# Patient Record
Sex: Female | Born: 1935
Health system: Southern US, Community
[De-identification: ages and names within clinical notes are randomized; demographics above are authoritative.]

## PROBLEM LIST (undated history)

## (undated) DIAGNOSIS — I10 Essential (primary) hypertension: Secondary | ICD-10-CM

## (undated) DIAGNOSIS — E079 Disorder of thyroid, unspecified: Secondary | ICD-10-CM

## (undated) DIAGNOSIS — E119 Type 2 diabetes mellitus without complications: Secondary | ICD-10-CM

## (undated) HISTORY — DX: Disorder of thyroid, unspecified: E07.9

## (undated) HISTORY — DX: Type 2 diabetes mellitus without complications: E11.9

## (undated) HISTORY — PX: HERNIA REPAIR: SHX51

## (undated) HISTORY — PX: ABDOMINAL HYSTERECTOMY: SHX81

## (undated) HISTORY — PX: APPENDECTOMY: SHX54

## (undated) HISTORY — DX: Essential (primary) hypertension: I10

---

## 2000-03-19 HISTORY — PX: BREAST EXCISIONAL BIOPSY: SUR124

## 2000-04-08 ENCOUNTER — Ambulatory Visit (HOSPITAL_COMMUNITY): Admission: RE | Admit: 2000-04-08 | Discharge: 2000-04-08 | Payer: Self-pay | Admitting: Cardiology

## 2000-05-03 ENCOUNTER — Encounter: Admission: RE | Admit: 2000-05-03 | Discharge: 2000-05-03 | Payer: Self-pay | Admitting: General Surgery

## 2000-05-03 ENCOUNTER — Other Ambulatory Visit: Admission: RE | Admit: 2000-05-03 | Discharge: 2000-05-03 | Payer: Self-pay | Admitting: General Surgery

## 2000-05-03 ENCOUNTER — Encounter (HOSPITAL_BASED_OUTPATIENT_CLINIC_OR_DEPARTMENT_OTHER): Payer: Self-pay | Admitting: General Surgery

## 2001-05-06 ENCOUNTER — Encounter: Admission: RE | Admit: 2001-05-06 | Discharge: 2001-05-06 | Payer: Self-pay | Admitting: General Surgery

## 2001-05-06 ENCOUNTER — Encounter (HOSPITAL_BASED_OUTPATIENT_CLINIC_OR_DEPARTMENT_OTHER): Payer: Self-pay | Admitting: General Surgery

## 2001-10-17 ENCOUNTER — Encounter: Payer: Self-pay | Admitting: Cardiology

## 2001-10-17 ENCOUNTER — Encounter: Admission: RE | Admit: 2001-10-17 | Discharge: 2001-10-17 | Payer: Self-pay | Admitting: Cardiology

## 2001-11-03 ENCOUNTER — Encounter: Admission: RE | Admit: 2001-11-03 | Discharge: 2002-02-01 | Payer: Self-pay | Admitting: Cardiology

## 2002-12-01 ENCOUNTER — Encounter: Admission: RE | Admit: 2002-12-01 | Discharge: 2003-03-01 | Payer: Self-pay | Admitting: Cardiology

## 2003-02-10 ENCOUNTER — Encounter: Admission: RE | Admit: 2003-02-10 | Discharge: 2003-02-10 | Payer: Self-pay | Admitting: Cardiology

## 2004-06-27 ENCOUNTER — Encounter: Admission: RE | Admit: 2004-06-27 | Discharge: 2004-06-27 | Payer: Self-pay | Admitting: Cardiology

## 2004-09-04 ENCOUNTER — Ambulatory Visit (HOSPITAL_COMMUNITY): Admission: RE | Admit: 2004-09-04 | Discharge: 2004-09-04 | Payer: Self-pay | Admitting: Neurology

## 2004-09-09 ENCOUNTER — Ambulatory Visit (HOSPITAL_COMMUNITY): Admission: RE | Admit: 2004-09-09 | Discharge: 2004-09-09 | Payer: Self-pay | Admitting: Cardiology

## 2004-10-26 ENCOUNTER — Encounter: Admission: RE | Admit: 2004-10-26 | Discharge: 2004-10-26 | Payer: Self-pay | Admitting: Gastroenterology

## 2006-06-18 ENCOUNTER — Encounter (HOSPITAL_COMMUNITY): Admission: RE | Admit: 2006-06-18 | Discharge: 2006-06-18 | Payer: Self-pay | Admitting: Cardiology

## 2007-03-20 HISTORY — PX: BREAST BIOPSY: SHX20

## 2007-04-07 ENCOUNTER — Encounter: Admission: RE | Admit: 2007-04-07 | Discharge: 2007-04-07 | Payer: Self-pay | Admitting: Cardiology

## 2007-04-07 ENCOUNTER — Encounter (INDEPENDENT_AMBULATORY_CARE_PROVIDER_SITE_OTHER): Payer: Self-pay | Admitting: Diagnostic Radiology

## 2007-05-21 ENCOUNTER — Encounter: Admission: RE | Admit: 2007-05-21 | Discharge: 2007-05-21 | Payer: Self-pay | Admitting: General Surgery

## 2007-09-02 ENCOUNTER — Encounter: Admission: RE | Admit: 2007-09-02 | Discharge: 2007-09-02 | Payer: Self-pay | Admitting: Cardiology

## 2007-09-05 ENCOUNTER — Encounter: Admission: RE | Admit: 2007-09-05 | Discharge: 2007-09-05 | Payer: Self-pay | Admitting: Gastroenterology

## 2008-03-10 ENCOUNTER — Encounter: Admission: RE | Admit: 2008-03-10 | Discharge: 2008-03-10 | Payer: Self-pay | Admitting: Cardiology

## 2008-03-15 ENCOUNTER — Encounter: Admission: RE | Admit: 2008-03-15 | Discharge: 2008-03-15 | Payer: Self-pay | Admitting: Cardiology

## 2009-03-16 ENCOUNTER — Encounter: Admission: RE | Admit: 2009-03-16 | Discharge: 2009-03-16 | Payer: Self-pay | Admitting: Cardiology

## 2009-05-17 ENCOUNTER — Encounter (HOSPITAL_COMMUNITY): Admission: RE | Admit: 2009-05-17 | Discharge: 2009-07-19 | Payer: Self-pay | Admitting: Endocrinology

## 2010-04-04 ENCOUNTER — Encounter
Admission: RE | Admit: 2010-04-04 | Discharge: 2010-04-04 | Payer: Self-pay | Source: Home / Self Care | Attending: Cardiology | Admitting: Cardiology

## 2010-04-09 ENCOUNTER — Encounter: Payer: Self-pay | Admitting: Cardiology

## 2010-05-18 ENCOUNTER — Other Ambulatory Visit: Payer: Self-pay | Admitting: Cardiology

## 2010-05-19 ENCOUNTER — Ambulatory Visit
Admission: RE | Admit: 2010-05-19 | Discharge: 2010-05-19 | Disposition: A | Discharge status: Home / Self Care | Payer: Medicare Other | Source: Ambulatory Visit | Attending: Cardiology | Admitting: Cardiology

## 2010-06-02 ENCOUNTER — Other Ambulatory Visit: Payer: Self-pay | Admitting: Gastroenterology

## 2010-06-07 ENCOUNTER — Ambulatory Visit
Admission: RE | Admit: 2010-06-07 | Discharge: 2010-06-07 | Disposition: A | Discharge status: Home / Self Care | Payer: Medicare Other | Source: Ambulatory Visit | Attending: Gastroenterology | Admitting: Gastroenterology

## 2010-06-07 MED ORDER — IOHEXOL 300 MG/ML  SOLN
80.0000 mL | Freq: Once | INTRAMUSCULAR | Status: AC | PRN
  Administered 2010-06-07: 80 mL via INTRAVENOUS

## 2010-06-12 ENCOUNTER — Other Ambulatory Visit (HOSPITAL_COMMUNITY): Payer: Self-pay | Admitting: Gastroenterology

## 2010-06-27 ENCOUNTER — Encounter (HOSPITAL_COMMUNITY): Payer: Self-pay

## 2010-06-27 ENCOUNTER — Encounter (HOSPITAL_COMMUNITY)
Admission: RE | Admit: 2010-06-27 | Discharge: 2010-06-27 | Disposition: A | Discharge status: Home / Self Care | Payer: Medicare Other | Source: Ambulatory Visit | Attending: Gastroenterology | Admitting: Gastroenterology

## 2010-06-27 DIAGNOSIS — R110 Nausea: Secondary | ICD-10-CM | POA: Insufficient documentation

## 2010-06-27 DIAGNOSIS — R109 Unspecified abdominal pain: Secondary | ICD-10-CM | POA: Insufficient documentation

## 2010-06-27 MED ORDER — TECHNETIUM TC 99M MEBROFENIN IV KIT
5.3000 | PACK | Freq: Once | INTRAVENOUS | Status: AC | PRN
  Administered 2010-06-27: 5.3 via INTRAVENOUS

## 2010-06-27 MED ORDER — SINCALIDE 5 MCG IJ SOLR: 0.0200 ug/kg | Freq: Once | INTRAMUSCULAR | Status: DC

## 2010-12-28 ENCOUNTER — Inpatient Hospital Stay (HOSPITAL_BASED_OUTPATIENT_CLINIC_OR_DEPARTMENT_OTHER)
Admission: RE | Admit: 2010-12-28 | Discharge: 2010-12-28 | Disposition: A | Discharge status: Home / Self Care | Payer: Medicare Other | Source: Ambulatory Visit | Attending: Cardiology | Admitting: Cardiology

## 2010-12-28 DIAGNOSIS — E119 Type 2 diabetes mellitus without complications: Secondary | ICD-10-CM | POA: Insufficient documentation

## 2010-12-28 DIAGNOSIS — I2510 Atherosclerotic heart disease of native coronary artery without angina pectoris: Secondary | ICD-10-CM | POA: Insufficient documentation

## 2010-12-28 DIAGNOSIS — I2582 Chronic total occlusion of coronary artery: Secondary | ICD-10-CM | POA: Insufficient documentation

## 2010-12-28 DIAGNOSIS — E039 Hypothyroidism, unspecified: Secondary | ICD-10-CM | POA: Insufficient documentation

## 2010-12-28 DIAGNOSIS — E7800 Pure hypercholesterolemia, unspecified: Secondary | ICD-10-CM | POA: Insufficient documentation

## 2010-12-28 DIAGNOSIS — I10 Essential (primary) hypertension: Secondary | ICD-10-CM | POA: Insufficient documentation

## 2010-12-28 LAB — POCT I-STAT GLUCOSE
Glucose, Bld: 170 mg/dL — ABNORMAL HIGH (ref 70–99)
Operator id: 141321

## 2011-01-11 NOTE — Cardiovascular Report (Signed)
Carolyn Bowen, Carolyn Bowen                 ACCOUNT NO.:  [id number]  MEDICAL RECORD NO.:  [id number]  LOCATION:  XRAY                         FACILITY:  [facility]  PHYSICIAN:  Breyon Sigg N. Terrence Dupont, M.D. DATE OF BIRTH:  04/24/1927  DATE OF PROCEDURE:  12/28/2010 DATE OF DISCHARGE:  06/27/2010                           CARDIAC CATHETERIZATION   PROCEDURE:  Left cardiac cath with selective left and right coronary angiography, measurement of LVEDP via right groin using Judkins technique.  INDICATIONS FOR THE PROCEDURE:  Carolyn Bowen is an 77 year old black female with past medical history significant for hypertension, non-insulin- dependent diabetes mellitus, hypercholesteremia, hyperthyroidism complains of vague recurrent retrosternal chest pain lasting few minutes, relieves with rest.  Also complains of exertional chest pain while working in the yard, relieves with rest.  Denies any nausea, vomiting, diaphoresis.  Denies shortness of breath. Denies any palpitation, lightheadedness, or syncope.  Denies PND, orthopnea, but complains of occasional leg swelling.  Denies relation of chest pain to food, breathing, or movement.  Denies any cardiac workup in the past.  PAST MEDICAL HISTORY:  As above.  PAST SURGICAL HISTORY:  He had right inguinal hernia repair, had appendectomy in the past, hysterectomy in the past, had ganglia cyst resection in the past.  SOCIAL HISTORY:  She is widowed.  Three children.  Retired.  No history of smoking or alcohol abuse.  FAMILY HISTORY:  Mother died at the age of 32 due to complications of congestive heart failure.  Father died of CA of prostate.  One brother had MI at the age of 8, one sister in good health.  ALLERGIES:  She is allergic to PENICILLIN AND CODEINE.  MEDICATION AT HOME:  She is on aspirin, Exforge, Nexium, atorvastatin, Premarin, methimazole, meclizine, Actos was recently stopped due to leg swelling.  EXAMINATION:  GENERAL: She is alert, awake,  oriented x3, in no acute distress. VITAL SIGNS: Blood pressure was 150/70, pulse was 75. HEENT: Conjunctiva was pink. NECK: Supple.  No JVD.  No bruit. LUNGS:  Clear to auscultation without rhonchi or rales. CARDIOVASCULAR: S1, S2 was normal.  There was soft systolic murmur. ABDOMEN: Soft.  Bowel sounds were present.  Nontender. EXTREMITIES: There is no clubbing, cyanosis, or edema.  IMPRESSION:  New-onset angina, rule out coronary insufficiency. Hypertension, non-insulin-dependent diabetes mellitus, not controlled by diet; hypercholesteremia; hypothyroidism; mild renal insufficiency. Discussed with the patient various options of treatment, i.e., noninvasive stress testing versus left cath, its risks and benefits, i.e., death, MI, stroke, local vascular complications, etc and consented for the left cath.  PROCEDURE:  After obtaining the informed consent, the patient was brought to the cath lab and was placed on fluoroscopy table.  Right groin was prepped and draped in usual fashion.  Xylocaine 1% was used for local anesthesia in the right groin.  With the help of thin-wall needle, 4-French arterial sheath was placed.  The sheath was aspirated and flushed.  Next, 4-French left Judkins catheter was advanced over the wire under fluoroscopic guidance up to the ascending aorta.  Wire was pulled out.  The catheter was aspirated and connected to the manifold. Catheter was further advanced and engaged into left coronary ostium. Multiple views of  the left system were taken.  Next, catheter was disengaged and was pulled out over the wire and was replaced with 4- [place] 3D right diagnostic catheter which was advanced over the wire under fluoroscopic guidance up to the ascending aorta.  Wire was pulled out.  The catheter was aspirated and connected to the manifold. Catheter was further advanced and engaged into right coronary ostium. Multiple views of the right system were taken.  Next, catheter  was disengaged and was pulled out over the wire and was replaced with 4- [place] pigtail catheter which was advanced over the wire under fluoroscopic guidance up to the ascending aorta.  Wire was pulled out. The catheter was aspirated and connected to the manifold.  Catheter was further advanced across the aortic valve into the LV.  LV pressures were recorded.  Then, pullback pressures were recorded from the aorta.  There was no significant gradient across the aortic valve.  Next, the pigtail catheter was pulled out over the wire, sheaths were aspirated and flushed.  FINDINGS:  LVEDP was 18 mmHg.  Left main was patent.  LAD has 30%-40% mid stenosis.  Diagonal I is very very small.  Diagonal II has 30%-40% ostial stenosis and then 100% occluded beyond the proximal portion. Distally the vessel  was very very small.  Left circumflex is patent. OM1 is very very small, OM2 is moderate size which is patent.  OM3 is very small which is patent.  RCA has 30%-45% proximal stenosis.  PDA and PLV branches were small which were patent.  The patient tolerated procedure well.  There were no complications.  The patient was transferred to recovery room in stable condition.     Carolyn Lai. Terrence Dupont, M.D.     MNH/MEDQ  D:  12/28/2010  T:  12/28/2010  Job:  [id number]  Electronically Signed by Charolette Forward M.D. on 01/11/2011 10:02:45 AM

## 2011-03-16 ENCOUNTER — Other Ambulatory Visit: Payer: Self-pay | Admitting: Cardiology

## 2011-03-16 DIAGNOSIS — Z1231 Encounter for screening mammogram for malignant neoplasm of breast: Secondary | ICD-10-CM

## 2011-04-09 ENCOUNTER — Ambulatory Visit
Admission: RE | Admit: 2011-04-09 | Discharge: 2011-04-09 | Disposition: A | Discharge status: Home / Self Care | Payer: Medicare Other | Source: Ambulatory Visit | Attending: Cardiology | Admitting: Cardiology

## 2011-04-09 DIAGNOSIS — Z1231 Encounter for screening mammogram for malignant neoplasm of breast: Secondary | ICD-10-CM

## 2011-09-29 ENCOUNTER — Other Ambulatory Visit: Payer: Self-pay | Admitting: Cardiology

## 2012-02-21 ENCOUNTER — Other Ambulatory Visit (HOSPITAL_COMMUNITY): Payer: Self-pay | Admitting: Gastroenterology

## 2012-02-21 DIAGNOSIS — R110 Nausea: Secondary | ICD-10-CM

## 2012-02-21 DIAGNOSIS — R140 Abdominal distension (gaseous): Secondary | ICD-10-CM

## 2012-02-21 DIAGNOSIS — R109 Unspecified abdominal pain: Secondary | ICD-10-CM

## 2012-03-03 ENCOUNTER — Ambulatory Visit (HOSPITAL_COMMUNITY)
Admission: RE | Admit: 2012-03-03 | Discharge: 2012-03-03 | Disposition: A | Discharge status: Home / Self Care | Payer: Medicare Other | Source: Ambulatory Visit | Attending: Gastroenterology | Admitting: Gastroenterology

## 2012-03-03 DIAGNOSIS — R141 Gas pain: Secondary | ICD-10-CM | POA: Insufficient documentation

## 2012-03-03 DIAGNOSIS — R109 Unspecified abdominal pain: Secondary | ICD-10-CM | POA: Insufficient documentation

## 2012-03-03 DIAGNOSIS — R110 Nausea: Secondary | ICD-10-CM | POA: Insufficient documentation

## 2012-03-03 DIAGNOSIS — R142 Eructation: Secondary | ICD-10-CM | POA: Insufficient documentation

## 2012-03-03 DIAGNOSIS — R140 Abdominal distension (gaseous): Secondary | ICD-10-CM

## 2012-03-03 MED ORDER — TECHNETIUM TC 99M SULFUR COLLOID
2.0000 | Freq: Once | INTRAVENOUS | Status: AC | PRN
  Administered 2012-03-03: 2 via INTRAVENOUS

## 2012-03-24 ENCOUNTER — Other Ambulatory Visit: Payer: Self-pay | Admitting: Cardiology

## 2012-03-24 DIAGNOSIS — Z1231 Encounter for screening mammogram for malignant neoplasm of breast: Secondary | ICD-10-CM

## 2012-03-26 ENCOUNTER — Other Ambulatory Visit: Payer: Self-pay | Admitting: Gastroenterology

## 2012-03-26 DIAGNOSIS — R109 Unspecified abdominal pain: Secondary | ICD-10-CM

## 2012-03-26 DIAGNOSIS — R140 Abdominal distension (gaseous): Secondary | ICD-10-CM

## 2012-03-26 DIAGNOSIS — R110 Nausea: Secondary | ICD-10-CM

## 2012-04-02 ENCOUNTER — Ambulatory Visit
Admission: RE | Admit: 2012-04-02 | Discharge: 2012-04-02 | Disposition: A | Discharge status: Home / Self Care | Payer: Medicare Other | Source: Ambulatory Visit | Attending: Gastroenterology | Admitting: Gastroenterology

## 2012-04-02 DIAGNOSIS — R110 Nausea: Secondary | ICD-10-CM

## 2012-04-02 DIAGNOSIS — R109 Unspecified abdominal pain: Secondary | ICD-10-CM

## 2012-04-02 DIAGNOSIS — R140 Abdominal distension (gaseous): Secondary | ICD-10-CM

## 2012-04-02 MED ORDER — IOHEXOL 300 MG/ML  SOLN
100.0000 mL | Freq: Once | INTRAMUSCULAR | Status: AC | PRN
  Administered 2012-04-02: 100 mL via INTRAVENOUS

## 2012-04-09 ENCOUNTER — Other Ambulatory Visit (HOSPITAL_COMMUNITY): Payer: Self-pay | Admitting: Gastroenterology

## 2012-04-09 DIAGNOSIS — R110 Nausea: Secondary | ICD-10-CM

## 2012-04-14 ENCOUNTER — Other Ambulatory Visit (HOSPITAL_COMMUNITY): Payer: Medicare Other

## 2012-04-15 ENCOUNTER — Ambulatory Visit (HOSPITAL_COMMUNITY)
Admission: RE | Admit: 2012-04-15 | Discharge: 2012-04-15 | Disposition: A | Discharge status: Home / Self Care | Payer: Medicare Other | Source: Ambulatory Visit | Attending: Gastroenterology | Admitting: Gastroenterology

## 2012-04-15 DIAGNOSIS — R110 Nausea: Secondary | ICD-10-CM

## 2012-04-15 DIAGNOSIS — K219 Gastro-esophageal reflux disease without esophagitis: Secondary | ICD-10-CM | POA: Insufficient documentation

## 2012-04-15 DIAGNOSIS — R109 Unspecified abdominal pain: Secondary | ICD-10-CM | POA: Insufficient documentation

## 2012-04-15 DIAGNOSIS — R141 Gas pain: Secondary | ICD-10-CM | POA: Insufficient documentation

## 2012-04-15 DIAGNOSIS — R142 Eructation: Secondary | ICD-10-CM | POA: Insufficient documentation

## 2012-04-15 MED ORDER — TECHNETIUM TC 99M SULFUR COLLOID: 2.0000 | Freq: Once | INTRAVENOUS | Status: AC | PRN

## 2012-04-16 ENCOUNTER — Ambulatory Visit: Payer: Medicare Other

## 2012-06-09 ENCOUNTER — Ambulatory Visit
Admission: RE | Admit: 2012-06-09 | Discharge: 2012-06-09 | Disposition: A | Discharge status: Home / Self Care | Payer: Medicare Other | Source: Ambulatory Visit | Attending: Cardiology | Admitting: Cardiology

## 2012-06-09 DIAGNOSIS — Z1231 Encounter for screening mammogram for malignant neoplasm of breast: Secondary | ICD-10-CM

## 2013-01-21 ENCOUNTER — Other Ambulatory Visit (HOSPITAL_COMMUNITY): Payer: Self-pay | Admitting: Cardiology

## 2013-01-21 DIAGNOSIS — R079 Chest pain, unspecified: Secondary | ICD-10-CM

## 2013-02-02 ENCOUNTER — Other Ambulatory Visit: Payer: Self-pay

## 2013-02-02 ENCOUNTER — Ambulatory Visit (HOSPITAL_COMMUNITY)
Admission: RE | Admit: 2013-02-02 | Discharge: 2013-02-02 | Disposition: A | Discharge status: Home / Self Care | Payer: Medicare Other | Source: Ambulatory Visit | Attending: Cardiology | Admitting: Cardiology

## 2013-02-02 ENCOUNTER — Encounter (HOSPITAL_COMMUNITY)
Admission: RE | Admit: 2013-02-02 | Discharge: 2013-02-02 | Disposition: A | Discharge status: Home / Self Care | Payer: Medicare Other | Source: Ambulatory Visit | Attending: Cardiology | Admitting: Cardiology

## 2013-02-02 DIAGNOSIS — R079 Chest pain, unspecified: Secondary | ICD-10-CM | POA: Insufficient documentation

## 2013-02-02 MED ORDER — TECHNETIUM TC 99M SESTAMIBI GENERIC - CARDIOLITE
10.0000 | Freq: Once | INTRAVENOUS | Status: AC | PRN
  Administered 2013-02-02: 10 via INTRAVENOUS

## 2013-02-02 MED ORDER — REGADENOSON 0.4 MG/5ML IV SOLN
INTRAVENOUS | Status: AC
  Administered 2013-02-02: 0.4 mg
  Filled 2013-02-02: qty 5

## 2013-02-02 MED ORDER — TECHNETIUM TC 99M SESTAMIBI GENERIC - CARDIOLITE
30.0000 | Freq: Once | INTRAVENOUS | Status: AC | PRN
  Administered 2013-02-02: 30 via INTRAVENOUS

## 2013-05-07 ENCOUNTER — Other Ambulatory Visit: Payer: Self-pay

## 2013-05-07 DIAGNOSIS — Z1231 Encounter for screening mammogram for malignant neoplasm of breast: Secondary | ICD-10-CM

## 2013-06-10 ENCOUNTER — Ambulatory Visit
Admission: RE | Admit: 2013-06-10 | Discharge: 2013-06-10 | Disposition: A | Discharge status: Home / Self Care | Payer: Medicare Other | Source: Ambulatory Visit

## 2013-06-10 DIAGNOSIS — Z1231 Encounter for screening mammogram for malignant neoplasm of breast: Secondary | ICD-10-CM

## 2014-05-17 ENCOUNTER — Other Ambulatory Visit: Payer: Self-pay

## 2014-05-17 DIAGNOSIS — Z1231 Encounter for screening mammogram for malignant neoplasm of breast: Secondary | ICD-10-CM

## 2014-06-14 ENCOUNTER — Ambulatory Visit
Admission: RE | Admit: 2014-06-14 | Discharge: 2014-06-14 | Disposition: A | Discharge status: Home / Self Care | Payer: Medicare Other | Source: Ambulatory Visit

## 2014-06-14 DIAGNOSIS — Z1231 Encounter for screening mammogram for malignant neoplasm of breast: Secondary | ICD-10-CM

## 2014-09-06 ENCOUNTER — Ambulatory Visit (INDEPENDENT_AMBULATORY_CARE_PROVIDER_SITE_OTHER): Payer: Medicare Other

## 2014-09-06 ENCOUNTER — Ambulatory Visit (INDEPENDENT_AMBULATORY_CARE_PROVIDER_SITE_OTHER): Payer: Medicare Other | Admitting: Emergency Medicine

## 2014-09-06 VITALS — BP 128/68 | HR 79 | Temp 97.8°F | Resp 17 | Ht 63.0 in | Wt 161.4 lb

## 2014-09-06 DIAGNOSIS — I10 Essential (primary) hypertension: Secondary | ICD-10-CM | POA: Diagnosis not present

## 2014-09-06 DIAGNOSIS — M546 Pain in thoracic spine: Secondary | ICD-10-CM | POA: Diagnosis not present

## 2014-09-06 LAB — POCT URINALYSIS DIPSTICK
BILIRUBIN UA: NEGATIVE
Blood, UA: NEGATIVE
GLUCOSE UA: NEGATIVE
KETONES UA: NEGATIVE
Leukocytes, UA: NEGATIVE
Nitrite, UA: NEGATIVE
Protein, UA: NEGATIVE
SPEC GRAV UA: 1.01
Urobilinogen, UA: 0.2
pH, UA: 5

## 2014-09-06 LAB — POCT UA - MICROSCOPIC ONLY
Casts, Ur, LPF, POC: NEGATIVE
Crystals, Ur, HPF, POC: NEGATIVE
Mucus, UA: NEGATIVE
YEAST UA: NEGATIVE

## 2014-09-06 LAB — POCT CBC
Granulocyte percent: 53.3 %G (ref 37–80)
HCT, POC: 37.5 % — AB (ref 37.7–47.9)
HEMOGLOBIN: 12.2 g/dL (ref 12.2–16.2)
LYMPH, POC: 2.7 (ref 0.6–3.4)
MCH: 28.8 pg (ref 27–31.2)
MCHC: 32.5 g/dL (ref 31.8–35.4)
MCV: 88.6 fL (ref 80–97)
MID (CBC): 0.9 (ref 0–0.9)
MPV: 9.1 fL (ref 0–99.8)
PLATELET COUNT, POC: 165 10*3/uL (ref 142–424)
POC GRANULOCYTE: 4.1 (ref 2–6.9)
POC LYMPH %: 35.5 % (ref 10–50)
POC MID %: 11.2 % (ref 0–12)
RBC: 4.23 M/uL (ref 4.04–5.48)
RDW, POC: 13.9 %
WBC: 7.7 10*3/uL (ref 4.6–10.2)

## 2014-09-06 MED ORDER — MELOXICAM 7.5 MG PO TABS: 7.5000 mg | ORAL_TABLET | Freq: Every day | ORAL | Status: DC

## 2014-09-06 NOTE — Patient Instructions (Signed)

## 2014-09-06 NOTE — Progress Notes (Signed)
Subjective:  Patient ID: Carolyn Bowen, female    DOB: [birth year]  Age: 81 y.o. MRN: [id number]  CC: Back Pain   HPI Carolyn Bowen presents  with a three-month history of back pain in her left CVA region. She has no dysuria urgency or frequency. No blood in her urine. She has no nausea or vomiting. No stool change. No blood in her stools. Black stools she has no sternal injury. Pain waxes and wanes. Sometimes gone. She takes Tylenol for the pain with some relief but not long-lived. She was out working in her garden over the weekend and has increased pain now. He was nonradiating. Not associated with any neurologic symptoms.   History Carolyn Bowen has a past medical history of Abdominal pain; Diabetes mellitus without complication; and Thyroid disease.   She has past surgical history that includes Appendectomy; Hernia repair; and Abdominal hysterectomy.   Her  family history includes Cancer in her father; Heart disease in her brother and mother.  She   reports that she has never smoked. She does not have any smokeless tobacco history on file. She reports that she does not drink alcohol or use illicit drugs.  No outpatient prescriptions prior to visit.   No facility-administered medications prior to visit.    History   Social History  . Marital Status: Widowed    Spouse Name: N/A  . Number of Children: N/A  . Years of Education: N/A   Social History Main Topics  . Smoking status: Never Smoker   . Smokeless tobacco: Not on file  . Alcohol Use: No  . Drug Use: No  . Sexual Activity: Not on file   Other Topics Concern  . Not on file   Social History Narrative     Review of Systems  Objective:  BP 128/68 mmHg  Pulse 79  Temp(Src) 97.8 F (36.6 C) (Oral)  Resp 17  Ht 5\' 3"  (1.6 m)  Wt 161 lb 6.4 oz (73.211 kg)  BMI 28.60 kg/m2  SpO2 98%  Physical Exam    Assessment & Plan:   Carolyn Bowen was seen today for back pain.  Diagnoses and all orders for this  visit:  Right-sided thoracic back pain Orders: -     POCT CBC -     POCT UA - Microscopic Only -     POCT urinalysis dipstick -     DG Chest 2 View; Future -     DG Lumbar Spine Complete; Future  Essential hypertension, benign Orders: -     POCT CBC -     POCT UA - Microscopic Only -     POCT urinalysis dipstick -     DG Chest 2 View; Future -     DG Lumbar Spine Complete; Future  Other orders -     meloxicam (MOBIC) 7.5 MG tablet; Take 1 tablet (7.5 mg total) by mouth daily.   I am having Carolyn Bowen start on meloxicam. I am also having her maintain her MECLIZINE HCL PO, atorvastatin, Amlodipine Besylate-Valsartan (EXFORGE PO), Aspirin (ASPIR-81 PO), methimazole, and estrogens (conjugated).  Meds ordered this encounter  Medications  . MECLIZINE HCL PO    Sig: Take by mouth as needed.  Marland Kitchen atorvastatin (LIPITOR) 20 MG tablet    Sig: Take 20 mg by mouth daily.  . Amlodipine Besylate-Valsartan (EXFORGE PO)    Sig: Take by mouth every morning.  . Aspirin (ASPIR-81 PO)    Sig: Take by mouth every morning.  . methimazole (  TAPAZOLE) 10 MG tablet    Sig: Take 10 mg by mouth every morning.  . estrogens, conjugated, (PREMARIN) 1.25 MG tablet    Sig: Take 1.25 mg by mouth daily.  . meloxicam (MOBIC) 7.5 MG tablet    Sig: Take 1 tablet (7.5 mg total) by mouth daily.    Dispense:  30 tablet    Refill:  2    Appropriate red flag conditions were discussed with the patient as well as actions that should be taken.  Patient expressed his understanding.  Follow-up: No Follow-up on file.  Roselee Culver, MD  Results for orders placed or performed in visit on 09/06/14  POCT CBC  Result Value Ref Range   WBC 7.7 4.6 - 10.2 K/uL   Lymph, poc 2.7 0.6 - 3.4   POC LYMPH PERCENT 35.5 10 - 50 %L   MID (cbc) 0.9 0 - 0.9   POC MID % 11.2 0 - 12 %M   POC Granulocyte 4.1 2 - 6.9   Granulocyte percent 53.3 37 - 80 %G   RBC 4.23 4.04 - 5.48 M/uL   Hemoglobin 12.2 12.2 - 16.2 g/dL   HCT,  POC 37.5 (A) 37.7 - 47.9 %   MCV 88.6 80 - 97 fL   MCH, POC 28.8 27 - 31.2 pg   MCHC 32.5 31.8 - 35.4 g/dL   RDW, POC 13.9 %   Platelet Count, POC 165 142 - 424 K/uL   MPV 9.1 0 - 99.8 fL  POCT UA - Microscopic Only  Result Value Ref Range   WBC, Ur, HPF, POC 2-4    RBC, urine, microscopic 0-1    Bacteria, U Microscopic 4+    Mucus, UA neg    Epithelial cells, urine per micros 8-15    Crystals, Ur, HPF, POC neg    Casts, Ur, LPF, POC neg    Yeast, UA neg   POCT urinalysis dipstick  Result Value Ref Range   Color, UA yellow    Clarity, UA cloudy    Glucose, UA neg    Bilirubin, UA neg    Ketones, UA neg    Spec Grav, UA 1.010    Blood, UA neg    pH, UA 5.0    Protein, UA neg    Urobilinogen, UA 0.2    Nitrite, UA neg    Leukocytes, UA Negative Negative   [facility] reading (PRIMARY) by  Dr. Ouida Sills.  Negative chest and spine.  Results for orders placed or performed in visit on 09/06/14  POCT CBC  Result Value Ref Range   WBC 7.7 4.6 - 10.2 K/uL   Lymph, poc 2.7 0.6 - 3.4   POC LYMPH PERCENT 35.5 10 - 50 %L   MID (cbc) 0.9 0 - 0.9   POC MID % 11.2 0 - 12 %M   POC Granulocyte 4.1 2 - 6.9   Granulocyte percent 53.3 37 - 80 %G   RBC 4.23 4.04 - 5.48 M/uL   Hemoglobin 12.2 12.2 - 16.2 g/dL   HCT, POC 37.5 (A) 37.7 - 47.9 %   MCV 88.6 80 - 97 fL   MCH, POC 28.8 27 - 31.2 pg   MCHC 32.5 31.8 - 35.4 g/dL   RDW, POC 13.9 %   Platelet Count, POC 165 142 - 424 K/uL   MPV 9.1 0 - 99.8 fL  POCT UA - Microscopic Only  Result Value Ref Range   WBC, Ur, HPF, POC 2-4  RBC, urine, microscopic 0-1    Bacteria, U Microscopic 4+    Mucus, UA neg    Epithelial cells, urine per micros 8-15    Crystals, Ur, HPF, POC neg    Casts, Ur, LPF, POC neg    Yeast, UA neg   POCT urinalysis dipstick  Result Value Ref Range   Color, UA yellow    Clarity, UA cloudy    Glucose, UA neg    Bilirubin, UA neg    Ketones, UA neg    Spec Grav, UA 1.010    Blood, UA neg    pH, UA 5.0     Protein, UA neg    Urobilinogen, UA 0.2    Nitrite, UA neg    Leukocytes, UA Negative Negative

## 2015-05-18 ENCOUNTER — Other Ambulatory Visit: Payer: Self-pay

## 2015-05-18 DIAGNOSIS — Z1231 Encounter for screening mammogram for malignant neoplasm of breast: Secondary | ICD-10-CM

## 2015-06-28 ENCOUNTER — Ambulatory Visit: Payer: Medicare Other

## 2015-07-04 ENCOUNTER — Ambulatory Visit
Admission: RE | Admit: 2015-07-04 | Discharge: 2015-07-04 | Disposition: A | Discharge status: Home / Self Care | Payer: Medicare Other | Source: Ambulatory Visit

## 2015-07-04 DIAGNOSIS — Z1231 Encounter for screening mammogram for malignant neoplasm of breast: Secondary | ICD-10-CM

## 2015-07-06 ENCOUNTER — Other Ambulatory Visit: Payer: Self-pay | Admitting: Cardiology

## 2015-07-06 DIAGNOSIS — R928 Other abnormal and inconclusive findings on diagnostic imaging of breast: Secondary | ICD-10-CM

## 2015-07-13 ENCOUNTER — Ambulatory Visit
Admission: RE | Admit: 2015-07-13 | Discharge: 2015-07-13 | Disposition: A | Discharge status: Home / Self Care | Payer: Medicare Other | Source: Ambulatory Visit | Attending: Cardiology | Admitting: Cardiology

## 2015-07-13 DIAGNOSIS — R928 Other abnormal and inconclusive findings on diagnostic imaging of breast: Secondary | ICD-10-CM

## 2015-07-28 ENCOUNTER — Ambulatory Visit: Payer: Medicare Other | Admitting: *Deleted

## 2015-08-08 ENCOUNTER — Encounter: Payer: Self-pay | Admitting: Dietician

## 2015-08-08 ENCOUNTER — Encounter: Payer: Medicare Other | Attending: Endocrinology | Admitting: Dietician

## 2015-08-08 VITALS — Ht 62.0 in | Wt 159.0 lb

## 2015-08-08 DIAGNOSIS — E119 Type 2 diabetes mellitus without complications: Secondary | ICD-10-CM | POA: Insufficient documentation

## 2015-08-08 DIAGNOSIS — E118 Type 2 diabetes mellitus with unspecified complications: Secondary | ICD-10-CM

## 2015-08-08 DIAGNOSIS — E1165 Type 2 diabetes mellitus with hyperglycemia: Secondary | ICD-10-CM

## 2015-08-08 DIAGNOSIS — IMO0002 Reserved for concepts with insufficient information to code with codable children: Secondary | ICD-10-CM

## 2015-08-08 NOTE — Patient Instructions (Signed)
Consider 2% milk rather than whole canned milk  Be sure to have meals on time. Be sure your beverages are sugar free. Watch the portion size of carbohydrate containing foods. Aim for 3-4 Carb Choices per meal (40-45 grams) +/- 1 either way  Aim for 0-1 Carbs per snack if hungry .  Have a snack each night (carbohydrate and protein) Include protein in moderation with your meals and snacks Consider reading food labels for Total Carbohydrate and Fat Grams of foods Consider  increasing your activity level by walking, gardening, or arm chair exercise for 30 minutes daily as tolerated Consider checking BG at alternate times per day as directed by MD

## 2015-08-08 NOTE — Progress Notes (Signed)
Diabetes Self-Management Education  Visit Type: First/Initial  Appt. Start Time: 1040 Appt. End Time: 1150  08/08/2015  [name], identified by name and date of birth, is a 82 y.o. female with a diagnosis of Diabetes: Type 2 for > 10 years.  Other Hx includes HTN and stage 2 CKD with GFR of 32.3. She lives alone and does her own cooking and shopping.  ASSESSMENT  Height 5\' 2"  (1.575 m), weight 159 lb (72.122 kg). Body mass index is 29.07 kg/(m^2).      Diabetes Self-Management Education - 08/08/15 1047    Visit Information   Visit Type First/Initial   Initial Visit   Diabetes Type Type 2   Are you currently following a meal plan? No   Are you taking your medications as prescribed? Not on Medications   Date Diagnosed more than 10 years ago   Health Coping   How would you rate your overall health? Fair   Psychosocial Assessment   Patient Belief/Attitude about Diabetes Motivated to manage diabetes   Self-care barriers None   Self-management support Doctor's office;Family   Other persons present Patient   Patient Concerns Nutrition/Meal planning;Glycemic Control   Special Needs None   Preferred Learning Style No preference indicated   Learning Readiness Ready   How often do you need to have someone help you when you read instructions, pamphlets, or other written materials from your doctor or pharmacy? 1 - Never   What is the last grade level you completed in school? 11th grade   Pre-Education Assessment   Patient understands the diabetes disease and treatment process. Needs Review   Patient understands incorporating nutritional management into lifestyle. Needs Instruction   Patient undertands incorporating physical activity into lifestyle. Needs Review   Patient understands monitoring blood glucose, interpreting and using results Needs Review   Patient understands prevention, detection, and treatment of acute complications. Demonstrates understanding / competency   Patient understands prevention, detection, and treatment of chronic complications. Demonstrates understanding / competency   Patient understands how to develop strategies to address psychosocial issues. Demonstrates understanding / competency   Patient understands how to develop strategies to promote health/change behavior. Demonstrates understanding / competency   Complications   Last HgB A1C per patient/outside source 8 %  06/14/15 increased from 7% in 2013.   How often do you check your blood sugar? 1-2 times/day   Fasting Blood glucose range (mg/dL) 130-179  137 this am reduced from 168 at MD's   Number of hypoglycemic episodes per month 0   Number of hyperglycemic episodes per week 0   Have you had a dilated eye exam in the past 12 months? Yes   Have you had a dental exam in the past 12 months? No   Are you checking your feet? Yes   How many days per week are you checking your feet? 7   Dietary Intake   Breakfast egg, sausage, 1 slice [employer] bread OR cereal (cornflakes) and diluted canned milk, coffee with 1 T canned milk, and sweet and low  8-9   Snack (morning) peanut butter and crackers (2)   Lunch sandwich (bologna or liver pudding and mustard) on [employer] bread, melon or bugle chips  1   Snack (afternoon) occasional peanut butter and cracker or cheese and cracker or ginger snap   Dinner starch (mashed potatoe, 1/2 sweet potatoe, mac and cheese or rice)  and vegetables, baked chicken or beef roast or pork tenderloin  6   Snack (evening) occasional  peanut butter and crackers or banana   Beverage(s) water with sugar free lemonade flavor and sweet and low, coffee   Exercise   Exercise Type Light (walking / raking leaves)   How many days per week to you exercise? 3   How many minutes per day do you exercise? 60   Total minutes per week of exercise 180   Patient Education   Previous Diabetes Education Yes (please comment)  years ago   Disease state  Definition of diabetes, type 1 and 2,  and the diagnosis of diabetes   Nutrition management  Role of diet in the treatment of diabetes and the relationship between the three main macronutrients and blood glucose level;Food label reading, portion sizes and measuring food.;Meal options for control of blood glucose level and chronic complications.   Physical activity and exercise  Role of exercise on diabetes management, blood pressure control and cardiac health.   Monitoring Identified appropriate SMBG and/or A1C goals.   Individualized Goals (developed by patient)   Nutrition General guidelines for healthy choices and portions discussed;Follow meal plan discussed   Physical Activity Exercise 5-7 days per week;30 minutes per day   Monitoring  test my blood glucose as discussed   Reducing Risk increase portions of olive oil in diet;increase portions of nuts and seeds   Health Coping discuss diabetes with (comment)  MD, RD, daughter   Post-Education Assessment   Patient understands the diabetes disease and treatment process. Demonstrates understanding / competency   Patient understands incorporating nutritional management into lifestyle. Demonstrates understanding / competency   Patient undertands incorporating physical activity into lifestyle. Demonstrates understanding / competency   Patient understands monitoring blood glucose, interpreting and using results Demonstrates understanding / competency   Patient understands prevention, detection, and treatment of acute complications. Demonstrates understanding / competency   Patient understands prevention, detection, and treatment of chronic complications. Demonstrates understanding / competency   Patient understands how to develop strategies to address psychosocial issues. Demonstrates understanding / competency   Patient understands how to develop strategies to promote health/change behavior. Demonstrates understanding / competency   Outcomes   Expected Outcomes Demonstrated interest in  learning. Expect positive outcomes   Future DMSE 2 months   Program Status Completed      Individualized Plan for Diabetes Self-Management Training:   Learning Objective:  Patient will have a greater understanding of diabetes self-management. Patient education plan is to attend individual and/or group sessions per assessed needs and concerns.   Plan:   Patient Instructions  Consider 2% milk rather than whole canned milk  Be sure to have meals on time. Be sure your beverages are sugar free. Watch the portion size of carbohydrate containing foods. Aim for 3-4 Carb Choices per meal (40-45 grams) +/- 1 either way  Aim for 0-1 Carbs per snack if hungry .  Have a snack each night (carbohydrate and protein) Include protein in moderation with your meals and snacks Consider reading food labels for Total Carbohydrate and Fat Grams of foods Consider  increasing your activity level by walking, gardening, or arm chair exercise for 30 minutes daily as tolerated Consider checking BG at alternate times per day as directed by MD      Expected Outcomes:  Demonstrated interest in learning. Expect positive outcomes  Education material provided: Food label handouts, A1C conversion sheet, Meal plan card, My Plate and Snack sheet  If problems or questions, patient to contact team via:  Phone and Email  Future DSME appointment: 2 months

## 2016-06-01 IMAGING — CR DG LUMBAR SPINE COMPLETE 4+V
5 series · 5 of 5 positions shown · non-contrast
Comparison: None.

CLINICAL DATA: Back pain, no known injury

EXAM:
LUMBAR SPINE - COMPLETE 4+ VIEW

[AP]
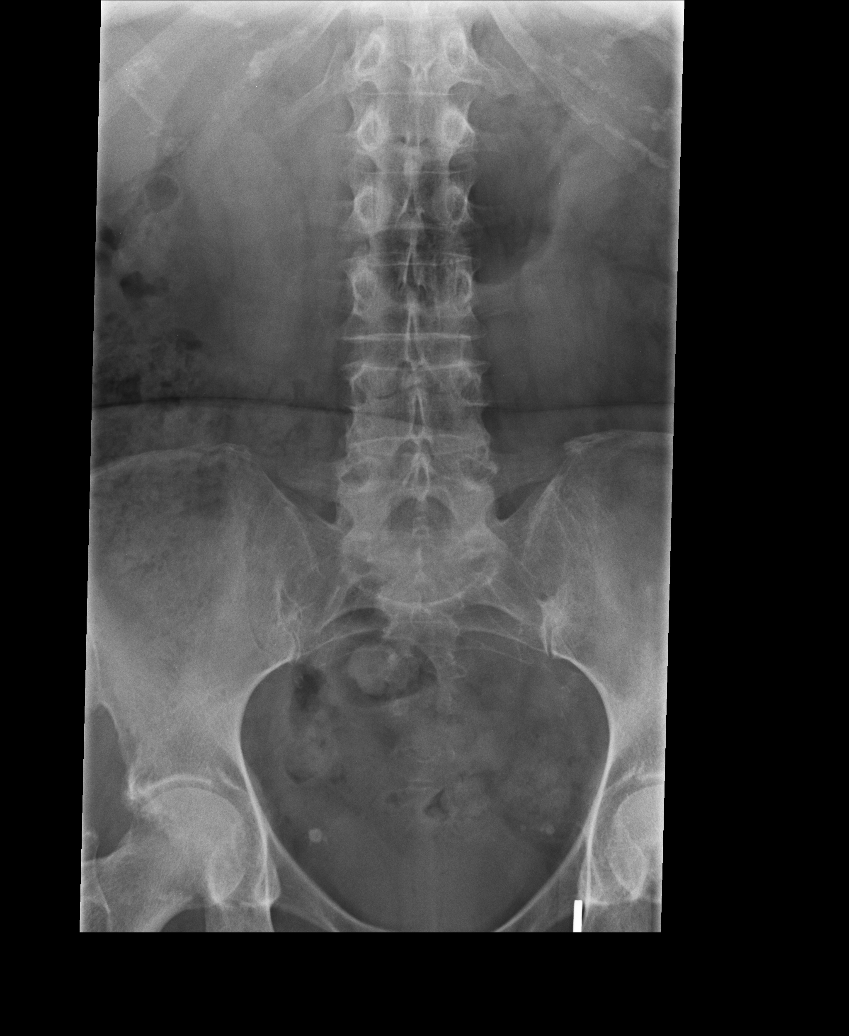

[rpo]
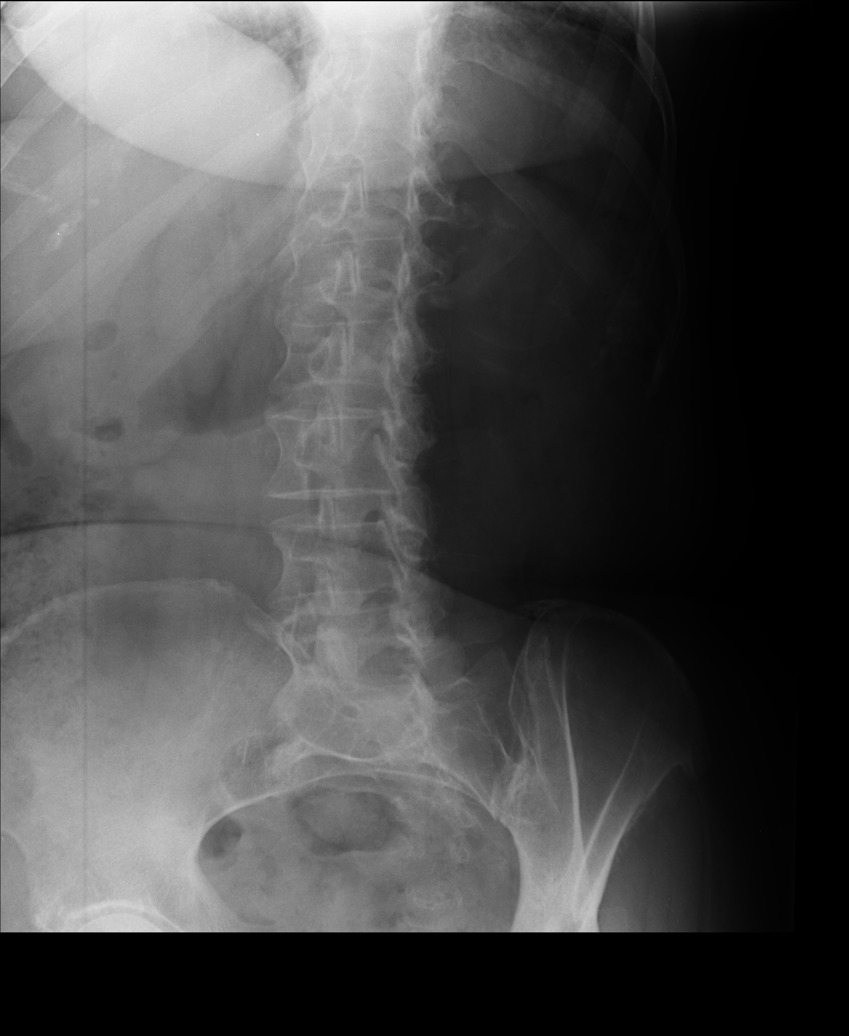

[lpo]
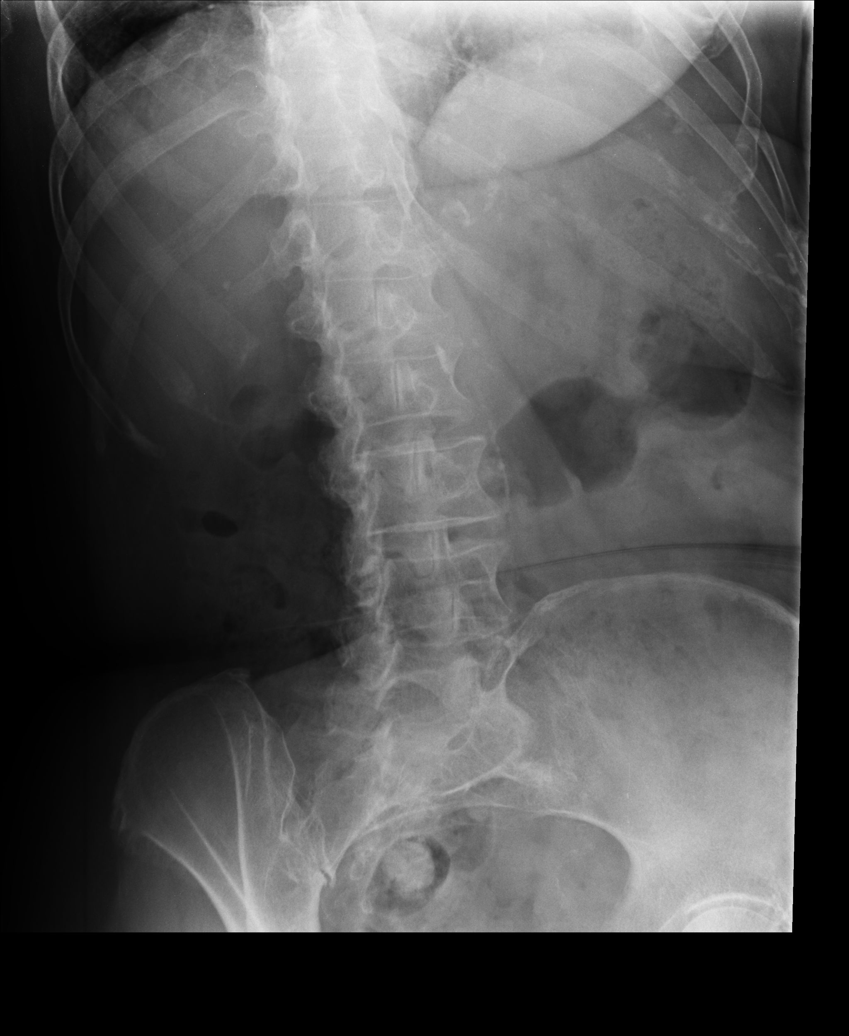

[lateral]
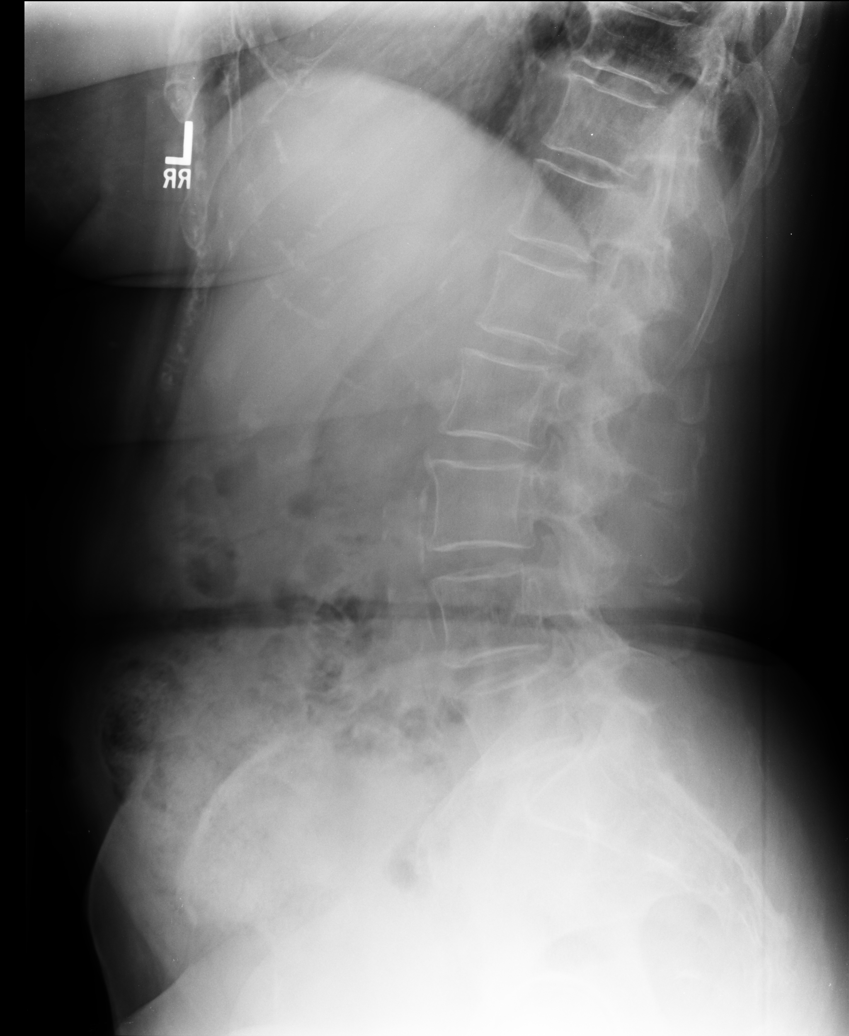

[l5 s1]
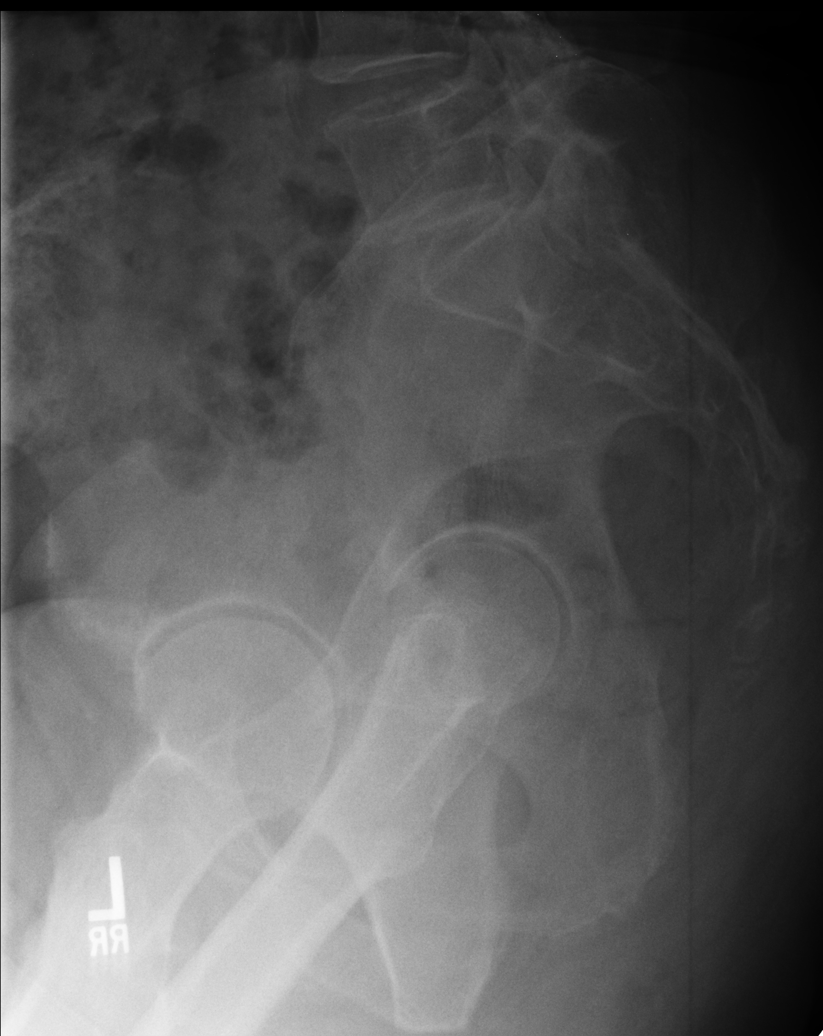

[5 of 5 positions shown; findings below may reference images not displayed]

FINDINGS: Five views of lumbar spine submitted. No acute fracture or
subluxation. Alignment and vertebral body heights are preserved.
Mild anterior spurring upper endplate of L3 vertebral body. Mild
anterior spurring upper endplate of L5 vertebral body.
Atherosclerotic calcifications of abdominal aorta. There is mild
disc space flattening at L5-S1 level. Facet degenerative changes are
noted L5 level.
IMPRESSION: No acute fracture or subluxation. Mild degenerative changes as
described above.

## 2016-07-02 ENCOUNTER — Other Ambulatory Visit: Payer: Self-pay | Admitting: Cardiology

## 2016-07-02 DIAGNOSIS — Z1231 Encounter for screening mammogram for malignant neoplasm of breast: Secondary | ICD-10-CM

## 2016-07-24 ENCOUNTER — Ambulatory Visit
Admission: RE | Admit: 2016-07-24 | Discharge: 2016-07-24 | Disposition: A | Discharge status: Home / Self Care | Payer: Medicare Other | Source: Ambulatory Visit | Attending: Cardiology | Admitting: Cardiology

## 2016-07-24 DIAGNOSIS — Z1231 Encounter for screening mammogram for malignant neoplasm of breast: Secondary | ICD-10-CM

## 2017-03-19 HISTORY — PX: CATARACT EXTRACTION: SUR2

## 2017-07-16 ENCOUNTER — Other Ambulatory Visit: Payer: Self-pay | Admitting: Cardiology

## 2017-07-16 DIAGNOSIS — Z139 Encounter for screening, unspecified: Secondary | ICD-10-CM

## 2017-08-14 ENCOUNTER — Ambulatory Visit
Admission: RE | Admit: 2017-08-14 | Discharge: 2017-08-14 | Disposition: A | Discharge status: Home / Self Care | Payer: Medicare Other | Source: Ambulatory Visit | Attending: Cardiology | Admitting: Cardiology

## 2017-08-14 DIAGNOSIS — Z139 Encounter for screening, unspecified: Secondary | ICD-10-CM

## 2018-07-24 ENCOUNTER — Other Ambulatory Visit: Payer: Self-pay | Admitting: Cardiology

## 2018-07-24 DIAGNOSIS — Z1231 Encounter for screening mammogram for malignant neoplasm of breast: Secondary | ICD-10-CM

## 2018-09-18 ENCOUNTER — Ambulatory Visit
Admission: RE | Admit: 2018-09-18 | Discharge: 2018-09-18 | Disposition: A | Discharge status: Home / Self Care | Payer: Medicare Other | Source: Ambulatory Visit | Attending: Cardiology | Admitting: Cardiology

## 2018-09-18 ENCOUNTER — Other Ambulatory Visit: Payer: Self-pay

## 2018-09-18 DIAGNOSIS — Z1231 Encounter for screening mammogram for malignant neoplasm of breast: Secondary | ICD-10-CM

## 2018-11-10 ENCOUNTER — Telehealth: Payer: Self-pay

## 2018-11-10 NOTE — Telephone Encounter (Signed)
Called patient to do their pre-visit COVID screening.  Call went to voicemail. Unable to do prescreening.  

## 2018-11-11 ENCOUNTER — Ambulatory Visit (INDEPENDENT_AMBULATORY_CARE_PROVIDER_SITE_OTHER): Payer: Medicare Other | Admitting: Internal Medicine

## 2018-11-11 ENCOUNTER — Encounter: Payer: Self-pay | Admitting: Internal Medicine

## 2018-11-11 ENCOUNTER — Other Ambulatory Visit: Payer: Self-pay

## 2018-11-11 VITALS — BP 139/72 | HR 79 | Temp 97.5°F | Resp 17 | Ht 62.5 in | Wt 145.8 lb

## 2018-11-11 DIAGNOSIS — N3001 Acute cystitis with hematuria: Secondary | ICD-10-CM

## 2018-11-11 DIAGNOSIS — R310 Gross hematuria: Secondary | ICD-10-CM

## 2018-11-11 DIAGNOSIS — I10 Essential (primary) hypertension: Secondary | ICD-10-CM

## 2018-11-11 DIAGNOSIS — N939 Abnormal uterine and vaginal bleeding, unspecified: Secondary | ICD-10-CM | POA: Diagnosis not present

## 2018-11-11 DIAGNOSIS — N8110 Cystocele, unspecified: Secondary | ICD-10-CM

## 2018-11-11 DIAGNOSIS — D649 Anemia, unspecified: Secondary | ICD-10-CM

## 2018-11-11 DIAGNOSIS — E0590 Thyrotoxicosis, unspecified without thyrotoxic crisis or storm: Secondary | ICD-10-CM | POA: Insufficient documentation

## 2018-11-11 DIAGNOSIS — E119 Type 2 diabetes mellitus without complications: Secondary | ICD-10-CM

## 2018-11-11 DIAGNOSIS — Z23 Encounter for immunization: Secondary | ICD-10-CM

## 2018-11-11 LAB — POCT URINALYSIS DIP (CLINITEK)
Bilirubin, UA: NEGATIVE
Glucose, UA: NEGATIVE mg/dL
Ketones, POC UA: NEGATIVE mg/dL
Nitrite, UA: POSITIVE — AB
POC PROTEIN,UA: 30 — AB
Spec Grav, UA: 1.02 (ref 1.010–1.025)
Urobilinogen, UA: 0.2 E.U./dL
pH, UA: 5.5 (ref 5.0–8.0)

## 2018-11-11 MED ORDER — CIPROFLOXACIN HCL 500 MG PO TABS: 500.0000 mg | ORAL_TABLET | Freq: Two times a day (BID) | ORAL | 0 refills | Status: DC

## 2018-11-11 NOTE — Patient Instructions (Signed)
You have been referred to a urogynecologist for further evaluation of the vaginal bleeding and prolapse.

## 2018-11-11 NOTE — Progress Notes (Signed)
Patient isn't fasting today.  States that she had Pneumonia vaccine last year at [facility]. Will request record.  States that FSBS was 138 this morning. Recently got meter fixed so she just started back checking blood sugars.

## 2018-11-11 NOTE — Progress Notes (Signed)
Patient ID: Carolyn Bowen, female    DOB: 08/19/1927  MRN: [id number]  CC: Establish Care, Diabetes, and Hypertension   Subjective: Carolyn Bowen is a 85 y.o. female who presents for new pt visit at [facility]. Her concerns today include:  Pt with hx of HTN, DM  No PCP.  Was basically seeing Dr.  Terrence Dupont for BP check. Also has an endocrinologist whom she sees for her thyroid and DM  Main concern today is an episode of noticing blood in the toilet after she urinated 3 days ago.  Patient states she urinated and just happened to look in the toilet and saw that it was filled with blood.  She thinks the blood was coming from the vagina.  A few minutes later she felt a gush of blood again that she assumed was coming from the vagina.  No previous episodes of hematuria or vaginal bleeding.  -She denies any dysuria or abdominal pain with the episode.  She tells me that her bladder has been hanging out from the vagina since about March of this year and has progressively gotten worse. -Hysterectomy done many years ago due to DU B. Pt on chronic estrogen for over 25 yrs. initially placed on it for hot flashes.  She is to take it daily but over the past 5 to 6 years she has been taking it just once a week.     HYPERTENSION Currently taking: see medication list - on Norvasc and Toprol Med Adherence: [x]  Yes    []  No Medication side effects: []  Yes    [x]  No Adherence with salt restriction: [x]  Yes    []  No Home Monitoring?: [x]  Yes    []  No Monitoring Frequency:  Once a day Home BP results range: SBP in the 130s, does not recall DBP SOB? []  Yes    [x]  No Chest Pain?: []  Yes    [x]  No Leg swelling?: [x]  Yes - sometimes.  Takes Furosemide as needed    Headaches?: []  Yes    [x]  No Dizziness? []  Yes    [x]  No Comments:   DM:  Checks BS once a day in mornings. Last meter was giving error readings.  Just got new meter.  This a.m was 138. Eating habits:  Was doing good until 3 wks ago when taste buds  started acting up.  Everything she eats taste salty and bitter.  Food just does not taste the same.  No problems swallowing or abdominal pains.  Stays active by working out in her yrd.  Has a veggie garden  Hx of over active thyroid.  On Tapazole 10 mg 1/2 tab 4 times a wk.  Sees endocrinologist, Dr. Chalmers Cater, at [facility].  Last seen about 1 mth ago.  Walks with a cane.  Has a little arthritis in hands No falls Lives alone, she is widowed for over 64 yrs. Pt is independent in ADLs. She has a tub but afraid to get in it.  She uses a basin to take baths.  She drives.   Daughter lives next door to her  Patient Active Problem List   Diagnosis Date Noted  . Essential hypertension, benign 09/06/2014     Current Outpatient Medications on File Prior to Visit  Medication Sig Dispense Refill  . amLODipine (NORVASC) 5 MG tablet Take 1 tablet by mouth daily.    Marland Kitchen estrogens, conjugated, (PREMARIN) 1.25 MG tablet Take 1 tablet by mouth once a week.    Marland Kitchen  furosemide (LASIX) 40 MG tablet Take 1 tablet by mouth daily as needed.    Marland Kitchen glimepiride (AMARYL) 1 MG tablet Take 1 tablet by mouth daily.    . methimazole (TAPAZOLE) 5 MG tablet Take 5 mg by mouth. Taking 1 tab Q Mon/Tue/Wed/Thur    . metoprolol succinate (TOPROL-XL) 25 MG 24 hr tablet Take 1 tablet by mouth daily.     No current facility-administered medications on file prior to visit.     Allergies  Allergen Reactions  . Codeine   . Penicillins Hives    Social History   Socioeconomic History  . Marital status: Widowed    Spouse name: Not on file  . Number of children: Not on file  . Years of education: Not on file  . Highest education level: Not on file  Occupational History  . Not on file  Social Needs  . Financial resource strain: Not on file  . Food insecurity    Worry: Not on file    Inability: Not on file  . Transportation needs    Medical: Not on file    Non-medical: Not on file  Tobacco Use  . Smoking status: Never  Smoker  . Smokeless tobacco: Never Used  Substance and Sexual Activity  . Alcohol use: No    Alcohol/week: 0.0 standard drinks  . Drug use: No  . Sexual activity: Not on file  Lifestyle  . Physical activity    Days per week: Not on file    Minutes per session: Not on file  . Stress: Not on file  Relationships  . Social Herbalist on phone: Not on file    Gets together: Not on file    Attends religious service: Not on file    Active member of club or organization: Not on file    Attends meetings of clubs or organizations: Not on file    Relationship status: Not on file  . Intimate partner violence    Fear of current or ex partner: Not on file    Emotionally abused: Not on file    Physically abused: Not on file    Forced sexual activity: Not on file  Other Topics Concern  . Not on file  Social History Narrative  . Not on file    Family History  Problem Relation Age of Onset  . Heart disease Mother   . Cancer Father   . Heart disease Brother   . Breast cancer Daughter     Past Surgical History:  Procedure Laterality Date  . ABDOMINAL HYSTERECTOMY    . APPENDECTOMY    . BREAST BIOPSY Right 2009  . BREAST EXCISIONAL BIOPSY Right 2002  . HERNIA REPAIR      ROS: Review of Systems Negative except as stated above  PHYSICAL EXAM: BP 139/72   Pulse 79   Temp (!) 97.5 F (36.4 C) (Temporal)   Resp 17   Ht 5' 2.5" (1.588 m)   Wt 145 lb 12.8 oz (66.1 kg)   SpO2 97%   BMI 26.24 kg/m   Physical Exam  General appearance - alert, well appearing, and in no distress Mental status - normal mood, behavior, speech, dress, motor activity, and thought processes Eyes: She wears glasses.  Nonicteric sclera. Mouth - mucous membranes moist, pharynx normal without lesions Neck - supple, no significant adenopathy Chest - clear to auscultation, no wheezes, rales or rhonchi, symmetric air entry Heart - normal rate, regular rhythm, normal S1, S2, no  murmurs, rubs,  clicks or gallops Abdomen - soft and nontender Pelvic -patient has a large tennis ball size mass protruding out of the vagina.  There are several shallow ulcers noted on this mass.  I was able to manually most of this back into the vagina Extremities -1+ bilateral ankle edema. MSK: Patient gait is slow but steady.  She ambulates with a cane.  CMP Latest Ref Rng & Units 12/28/2010  Glucose 70 - 99 mg/dL 170(H)   Lipid Panel  No results found for: CHOL, TRIG, HDL, CHOLHDL, VLDL, LDLCALC, LDLDIRECT  CBC    Component Value Date/Time   WBC 7.7 09/06/2014 1033   RBC 4.23 09/06/2014 1033   HGB 12.2 09/06/2014 1033   HCT 37.5 (A) 09/06/2014 1033   MCV 88.6 09/06/2014 1033   MCH 28.8 09/06/2014 1033   MCHC 32.5 09/06/2014 1033   Results for orders placed or performed in visit on 11/11/18  POCT URINALYSIS DIP (CLINITEK)  Result Value Ref Range   Color, UA yellow yellow   Clarity, UA cloudy (A) clear   Glucose, UA negative negative mg/dL   Bilirubin, UA negative negative   Ketones, POC UA negative negative mg/dL   Spec Grav, UA 1.020 1.010 - 1.025   Blood, UA moderate (A) negative   pH, UA 5.5 5.0 - 8.0   POC PROTEIN,UA =30 (A) negative, trace   Urobilinogen, UA 0.2 0.2 or 1.0 E.U./dL   Nitrite, UA Positive (A) Negative   Leukocytes, UA Small (1+) (A) Negative    ASSESSMENT AND PLAN: 1. Vaginal bleeding 2. Hematuria, gross 3. Vaginal prolapse -Patient was unable to give a urine sample.  We will need to get her to a uro-gynecologist as soon as possible for work-up and management.  Patient thinks that the bleeding was coming from the vagina but it could have been hematuria or 1 of the shallow ulcers on the prolapsed vagina could have bled -I am not sure why she was left plan estrogen for so long. - POCT URINALYSIS DIP (CLINITEK) - Ambulatory referral to Urogynecology   4. Essential hypertension Blood pressure is not 130/80 or lower but given her age I think the current blood  pressure is acceptable.  She will continue amlodipine and metoprolol at the current dose.  5. Controlled type 2 diabetes mellitus without complication, without long-term current use of insulin ([place]) Encourage healthy eating habits and trying to stay active which she has been doing.  She may try supplementing meals with Glucerna shakes if she feels that her taste buds has changed towards food - CBC - Comprehensive metabolic panel - Lipid panel - Hemoglobin A1c  6. Hyperthyroidism Followed by endocrinologist  7. Needs flu shot - Flu Vaccine QUAD 6+ mos PF IM (Fluarix Quad PF)  Patient was given the opportunity to ask questions.  Patient verbalized understanding of the plan and was able to repeat key elements of the plan.   8.acute cystitis  -ADDENDUM: Patient was eventually able to give a urine sample.  This revealed positive nitrates, LEs and blood.  Will place on Cipro abx.  I called and left a message on patient's voicemail at home informing her urine results and is prescribing the antibiotics.  Advised to monitor her blood sugars closely while taking the Cipro as it can sometimes cause low blood sugar.  Orders Placed This Encounter  Procedures  . Flu Vaccine QUAD 6+ mos PF IM (Fluarix Quad PF)  . CBC  . Comprehensive metabolic panel  . Lipid  panel  . Hemoglobin A1c  . Ambulatory referral to Urogynecology  . POCT URINALYSIS DIP (CLINITEK)     Requested Prescriptions    No prescriptions requested or ordered in this encounter    Return in about 7 weeks (around 12/30/2018).  Karle Plumber, MD, FACP

## 2018-11-12 ENCOUNTER — Telehealth: Payer: Self-pay

## 2018-11-12 ENCOUNTER — Telehealth: Payer: Self-pay | Admitting: Internal Medicine

## 2018-11-12 DIAGNOSIS — N184 Chronic kidney disease, stage 4 (severe): Secondary | ICD-10-CM

## 2018-11-12 LAB — COMPREHENSIVE METABOLIC PANEL
ALT: 18 IU/L (ref 0–32)
AST: 19 IU/L (ref 0–40)
Albumin/Globulin Ratio: 1 — ABNORMAL LOW (ref 1.2–2.2)
Albumin: 4 g/dL (ref 3.6–4.6)
Alkaline Phosphatase: 87 IU/L (ref 39–117)
BUN/Creatinine Ratio: 18 (ref 12–28)
BUN: 39 mg/dL — ABNORMAL HIGH (ref 8–27)
Bilirubin Total: 0.3 mg/dL (ref 0.0–1.2)
CO2: 19 mmol/L — ABNORMAL LOW (ref 20–29)
Calcium: 9.9 mg/dL (ref 8.7–10.3)
Chloride: 105 mmol/L (ref 96–106)
Creatinine, Ser: 2.12 mg/dL — ABNORMAL HIGH (ref 0.57–1.00)
GFR calc Af Amer: 23 mL/min/{1.73_m2} — ABNORMAL LOW (ref >59)
GFR calc non Af Amer: 20 mL/min/{1.73_m2} — ABNORMAL LOW (ref >59)
Globulin, Total: 4.1 g/dL (ref 1.5–4.5)
Glucose: 124 mg/dL — ABNORMAL HIGH (ref 65–99)
Potassium: 4.1 mmol/L (ref 3.5–5.2)
Sodium: 140 mmol/L (ref 134–144)
Total Protein: 8.1 g/dL (ref 6.0–8.5)

## 2018-11-12 LAB — HEMOGLOBIN A1C
Est. average glucose Bld gHb Est-mCnc: 171 mg/dL
Hgb A1c MFr Bld: 7.6 % — ABNORMAL HIGH (ref 4.8–5.6)

## 2018-11-12 LAB — CBC
Hematocrit: 29.7 % — ABNORMAL LOW (ref 34.0–46.6)
Hemoglobin: 9.7 g/dL — ABNORMAL LOW (ref 11.1–15.9)
MCH: 27.4 pg (ref 26.6–33.0)
MCHC: 32.7 g/dL (ref 31.5–35.7)
MCV: 84 fL (ref 79–97)
Platelets: 271 10*3/uL (ref 150–450)
RBC: 3.54 x10E6/uL — ABNORMAL LOW (ref 3.77–5.28)
RDW: 13.6 % (ref 11.7–15.4)
WBC: 8.8 10*3/uL (ref 3.4–10.8)

## 2018-11-12 LAB — LIPID PANEL
Chol/HDL Ratio: 2.4 ratio (ref 0.0–4.4)
Cholesterol, Total: 133 mg/dL (ref 100–199)
HDL: 55 mg/dL (ref >39)
LDL Calculated: 51 mg/dL (ref 0–99)
Triglycerides: 134 mg/dL (ref 0–149)
VLDL Cholesterol Cal: 27 mg/dL (ref 5–40)

## 2018-11-12 MED ORDER — CIPROFLOXACIN HCL 500 MG PO TABS: 500.0000 mg | ORAL_TABLET | Freq: Every day | ORAL | 0 refills | Status: DC

## 2018-11-12 NOTE — Telephone Encounter (Signed)
Called [facility] to add on the iron, ferritin, TIBC panel. There is not enough serum left from the labs drawn yesterday. Can you contact patient to schedule a lab draw at her convenience? She doesn't have to be fasting.  Letter mentioned below has been printed & will go out with the mail today.

## 2018-11-12 NOTE — Telephone Encounter (Signed)
PC placed to pt this a.m to discuss lab results.  I left VMM letting her know that her kidney function is significantly abnormal and I will need to send her to a kidney specialist to eval further.  She is also anemic.  Will ask lab to add iron studies.  Reminded her that I called in some abx for her yesterday to her pharmacy.  I tried calling pt's daughter also but number listed in chart is not in service. I then called [facility] on [address] to inquire whether pt had picked up rxn for Cipro as yet.  She had not.  I requested the the rxn be cancelled.  I am sending new rxn for Cipro adjusted to kidney function.  Results for orders placed or performed in visit on 11/11/18  CBC  Result Value Ref Range   WBC 8.8 3.4 - 10.8 x10E3/uL   RBC 3.54 (L) 3.77 - 5.28 x10E6/uL   Hemoglobin 9.7 (L) 11.1 - 15.9 g/dL   Hematocrit 29.7 (L) 34.0 - 46.6 %   MCV 84 79 - 97 fL   MCH 27.4 26.6 - 33.0 pg   MCHC 32.7 31.5 - 35.7 g/dL   RDW 13.6 11.7 - 15.4 %   Platelets 271 150 - 450 x10E3/uL  Comprehensive metabolic panel  Result Value Ref Range   Glucose 124 (H) 65 - 99 mg/dL   BUN 39 (H) 8 - 27 mg/dL   Creatinine, Ser 2.12 (H) 0.57 - 1.00 mg/dL   GFR calc non Af Amer 20 (L) >59 mL/min/1.73   GFR calc Af Amer 23 (L) >59 mL/min/1.73   BUN/Creatinine Ratio 18 12 - 28   Sodium 140 134 - 144 mmol/L   Potassium 4.1 3.5 - 5.2 mmol/L   Chloride 105 96 - 106 mmol/L   CO2 19 (L) 20 - 29 mmol/L   Calcium 9.9 8.7 - 10.3 mg/dL   Total Protein 8.1 6.0 - 8.5 g/dL   Albumin 4.0 3.6 - 4.6 g/dL   Globulin, Total 4.1 1.5 - 4.5 g/dL   Albumin/Globulin Ratio 1.0 (L) 1.2 - 2.2   Bilirubin Total 0.3 0.0 - 1.2 mg/dL   Alkaline Phosphatase 87 39 - 117 IU/L   AST 19 0 - 40 IU/L   ALT 18 0 - 32 IU/L  Lipid panel  Result Value Ref Range   Cholesterol, Total 133 100 - 199 mg/dL   Triglycerides 134 0 - 149 mg/dL   HDL 55 >39 mg/dL   VLDL Cholesterol Cal 27 5 - 40 mg/dL   LDL Calculated 51 0 - 99 mg/dL   Chol/HDL Ratio  2.4 0.0 - 4.4 ratio  Hemoglobin A1c  Result Value Ref Range   Hgb A1c MFr Bld 7.6 (H) 4.8 - 5.6 %   Est. average glucose Bld gHb Est-mCnc 171 mg/dL  POCT URINALYSIS DIP (CLINITEK)  Result Value Ref Range   Color, UA yellow yellow   Clarity, UA cloudy (A) clear   Glucose, UA negative negative mg/dL   Bilirubin, UA negative negative   Ketones, POC UA negative negative mg/dL   Spec Grav, UA 1.020 1.010 - 1.025   Blood, UA moderate (A) negative   pH, UA 5.5 5.0 - 8.0   POC PROTEIN,UA =30 (A) negative, trace   Urobilinogen, UA 0.2 0.2 or 1.0 E.U./dL   Nitrite, UA Positive (A) Negative   Leukocytes, UA Small (1+) (A) Negative

## 2018-11-12 NOTE — Telephone Encounter (Signed)
-----   Message from Ladell Pier, MD sent at 11/12/2018 10:54 AM EDT ----- I have written a letter to send to this pt.  Please print and mail to her. You do not have to wait for me to sign the letter.  Also let front desk know that I submitted a referral for her to see the nephrologist and urogynecologist.  Please try to schedule as soon as possible. Tell the lab tech who is there today that I need to add iron/ferritin and TIBC to labs drawn yesterday.  I have put in the orders.  Please let me know whether they are able to add.

## 2018-11-12 NOTE — Telephone Encounter (Signed)
Patient called in after hearing your voicemail from this morning.  She is agreeable to seeing a kidney specialist. She states that she had a colonoscopy about 8 years ago with Dr. Michail Sermon. She thinks it was normal.  She has to check with her daughter's schedule but she plans on calling back to make a lab appointment for 11/13/2018 to get the Iron, Ferritin, TIBC panel drawn.

## 2018-11-12 NOTE — Addendum Note (Signed)
Addended by: Karle Plumber B on: 11/12/2018 10:58 AM   Modules accepted: Orders

## 2018-11-13 ENCOUNTER — Other Ambulatory Visit: Payer: Medicare Other

## 2018-11-13 ENCOUNTER — Other Ambulatory Visit: Payer: Self-pay

## 2018-11-13 NOTE — Telephone Encounter (Signed)
I called [facility] myself. The lab/rad tech doesn't handle add-on tests. The Iron, Ferritin, TIBC panel is ran from a serum separator tube. They only had 3.2 ml of serum left after running the original tests. Patient has a lab appointment for this morning to get another SST drawn in order to run the panel that you wanted done.

## 2018-11-13 NOTE — Progress Notes (Signed)
Patient here for additional labs that were unable to be added on to existing specimen.

## 2018-11-14 LAB — IRON,TIBC AND FERRITIN PANEL
Ferritin: 580 ng/mL — ABNORMAL HIGH (ref 15–150)
Iron Saturation: 9 % — CL (ref 15–55)
Iron: 16 ug/dL — ABNORMAL LOW (ref 27–139)
Total Iron Binding Capacity: 170 ug/dL — ABNORMAL LOW (ref 250–450)
UIBC: 154 ug/dL (ref 118–369)

## 2018-11-14 LAB — URINE CULTURE

## 2018-11-20 NOTE — Progress Notes (Signed)
Patient notified of results & recommendations. Expressed understanding.

## 2018-12-30 ENCOUNTER — Ambulatory Visit (INDEPENDENT_AMBULATORY_CARE_PROVIDER_SITE_OTHER): Payer: Medicare Other | Admitting: Internal Medicine

## 2018-12-30 DIAGNOSIS — N8110 Cystocele, unspecified: Secondary | ICD-10-CM

## 2018-12-30 DIAGNOSIS — R310 Gross hematuria: Secondary | ICD-10-CM | POA: Diagnosis not present

## 2018-12-30 DIAGNOSIS — N184 Chronic kidney disease, stage 4 (severe): Secondary | ICD-10-CM | POA: Insufficient documentation

## 2018-12-30 DIAGNOSIS — N8189 Other female genital prolapse: Secondary | ICD-10-CM

## 2018-12-30 DIAGNOSIS — D638 Anemia in other chronic diseases classified elsewhere: Secondary | ICD-10-CM | POA: Diagnosis not present

## 2018-12-30 DIAGNOSIS — N182 Chronic kidney disease, stage 2 (mild): Secondary | ICD-10-CM | POA: Insufficient documentation

## 2018-12-30 MED ORDER — FERROUS SULFATE 325 (65 FE) MG PO TBEC: 325.0000 mg | DELAYED_RELEASE_TABLET | Freq: Three times a day (TID) | ORAL | 3 refills | Status: DC

## 2018-12-30 NOTE — Progress Notes (Signed)
Virtual Visit via Telephone Note Due to current restrictions/limitations of in-office visits due to the COVID-19 pandemic, this scheduled clinical appointment was converted to a telehealth visit  I connected with Carolyn Bowen on 12/30/18 at 10:28 a.m by telephone and verified that I am speaking with the correct person using two identifiers. I am in my office.  The patient is at home.  Only the patient and myself participated in this encounter.  I discussed the limitations, risks, security and privacy concerns of performing an evaluation and management service by telephone and the availability of in person appointments. I also discussed with the patient that there may be a patient responsible charge related to this service. The patient expressed understanding and agreed to proceed.   History of Present Illness: With history of vaginal prolapse, DM, hypothyroidism, CKD, anemia of chronic disease.  Purpose of today's visit was 6 weeks follow-up on hematuria.  I saw patient for the first time about 5 to 6 weeks ago.  She complained of hematuria.  She was found to have acute cystitis with culture positive for E. coli and significant vaginal prolapse.  She was treated with antibiotics and I referred her to a uro-gynecologist.  However patient ended up seeing a urologist at [facility].  She had what sounds like a cystoscopy and CAT scan.  She reports being told that her bladder looked okay and that they were referring her to a gynecologist.  She tells me that she still noticed a little blood but not as much as before.  She states she was told by the urologist that the blood may be coming from the rectum.  However she has not noted any blood in the stools or any change in color of stools.  CKD:  Found to have stage 4 CKD on blood test.  She does not recall ever being told in the past by any of her previous doctors that she had a problem with her kidney. Makes good urine especially in the  mornings. -She was also found to be anemic on recent blood test.  Iron studies were consistent with anemia of chronic disease.  -I referred her to [facility].  She states she was told that she is on the waiting list.   Outpatient Encounter Medications as of 12/30/2018  Medication Sig  . acitretin (SORIATANE) 10 MG capsule 1 CAPSULE WITH A MEAL ONCE A DAY ORALLY 30  . amLODipine (NORVASC) 5 MG tablet Take 1 tablet by mouth daily.  Marland Kitchen atorvastatin (LIPITOR) 20 MG tablet Take 1 tablet by mouth daily.  Marland Kitchen estrogens, conjugated, (PREMARIN) 1.25 MG tablet Take 1 tablet by mouth once a week.  . furosemide (LASIX) 40 MG tablet Take 1 tablet by mouth daily as needed.  Marland Kitchen glimepiride (AMARYL) 1 MG tablet Take 1 tablet by mouth daily.  Marland Kitchen losartan (COZAAR) 100 MG tablet Take 1 tablet by mouth daily.  . methimazole (TAPAZOLE) 5 MG tablet Take 5 mg by mouth. Taking 1 tab Q Mon/Tue/Wed/Thur  . metoprolol succinate (TOPROL-XL) 25 MG 24 hr tablet Take 1 tablet by mouth daily.  . [DISCONTINUED] ciprofloxacin (CIPRO) 500 MG tablet Take 1 tablet (500 mg total) by mouth daily with breakfast.   No facility-administered encounter medications on file as of 12/30/2018.       Observations/Objective: Results for orders placed or performed in visit on 11/11/18  Urine Culture   Specimen: Urine   URINE  Result Value Ref Range   Urine Culture, Routine Final report (A)  Organism ID, Bacteria Comment (A)    ORGANISM ID, BACTERIA Comment    Antimicrobial Susceptibility Comment   CBC  Result Value Ref Range   WBC 8.8 3.4 - 10.8 x10E3/uL   RBC 3.54 (L) 3.77 - 5.28 x10E6/uL   Hemoglobin 9.7 (L) 11.1 - 15.9 g/dL   Hematocrit 29.7 (L) 34.0 - 46.6 %   MCV 84 79 - 97 fL   MCH 27.4 26.6 - 33.0 pg   MCHC 32.7 31.5 - 35.7 g/dL   RDW 13.6 11.7 - 15.4 %   Platelets 271 150 - 450 x10E3/uL  Comprehensive metabolic panel  Result Value Ref Range   Glucose 124 (H) 65 - 99 mg/dL   BUN 39 (H) 8 - 27 mg/dL    Creatinine, Ser 2.12 (H) 0.57 - 1.00 mg/dL   GFR calc non Af Amer 20 (L) >59 mL/min/1.73   GFR calc Af Amer 23 (L) >59 mL/min/1.73   BUN/Creatinine Ratio 18 12 - 28   Sodium 140 134 - 144 mmol/L   Potassium 4.1 3.5 - 5.2 mmol/L   Chloride 105 96 - 106 mmol/L   CO2 19 (L) 20 - 29 mmol/L   Calcium 9.9 8.7 - 10.3 mg/dL   Total Protein 8.1 6.0 - 8.5 g/dL   Albumin 4.0 3.6 - 4.6 g/dL   Globulin, Total 4.1 1.5 - 4.5 g/dL   Albumin/Globulin Ratio 1.0 (L) 1.2 - 2.2   Bilirubin Total 0.3 0.0 - 1.2 mg/dL   Alkaline Phosphatase 87 39 - 117 IU/L   AST 19 0 - 40 IU/L   ALT 18 0 - 32 IU/L  Lipid panel  Result Value Ref Range   Cholesterol, Total 133 100 - 199 mg/dL   Triglycerides 134 0 - 149 mg/dL   HDL 55 >39 mg/dL   VLDL Cholesterol Cal 27 5 - 40 mg/dL   LDL Calculated 51 0 - 99 mg/dL   Chol/HDL Ratio 2.4 0.0 - 4.4 ratio  Hemoglobin A1c  Result Value Ref Range   Hgb A1c MFr Bld 7.6 (H) 4.8 - 5.6 %   Est. average glucose Bld gHb Est-mCnc 171 mg/dL  Iron, TIBC and Ferritin Panel  Result Value Ref Range   Total Iron Binding Capacity 170 (L) 250 - 450 ug/dL   UIBC 154 118 - 369 ug/dL   Iron 16 (L) 27 - 139 ug/dL   Iron Saturation 9 (LL) 15 - 55 %   Ferritin 580 (H) 15 - 150 ng/mL  POCT URINALYSIS DIP (CLINITEK)  Result Value Ref Range   Color, UA yellow yellow   Clarity, UA cloudy (A) clear   Glucose, UA negative negative mg/dL   Bilirubin, UA negative negative   Ketones, POC UA negative negative mg/dL   Spec Grav, UA 1.020 1.010 - 1.025   Blood, UA moderate (A) negative   pH, UA 5.5 5.0 - 8.0   POC PROTEIN,UA =30 (A) negative, trace   Urobilinogen, UA 0.2 0.2 or 1.0 E.U./dL   Nitrite, UA Positive (A) Negative   Leukocytes, UA Small (1+) (A) Negative     Assessment and Plan: 1. Vaginal prolapse - Ambulatory referral to Gynecology  2. Hematuria, gross I have requested her records from [facility] to confirm the work-up that she has had.  3. Anemia, chronic disease -  ferrous sulfate 325 (65 FE) MG EC tablet; Take 1 tablet (325 mg total) by mouth 3 (three) times daily with meals.  Dispense: 100 tablet; Refill: 3  4. CKD (chronic  kidney disease) stage 4, GFR 15-29 ml/min ([facility]) Await appointment with [facility]   Follow Up Instructions: 7 wks   I discussed the assessment and treatment plan with the patient. The patient was provided an opportunity to ask questions and all were answered. The patient agreed with the plan and demonstrated an understanding of the instructions.   The patient was advised to call back or seek an in-person evaluation if the symptoms worsen or if the condition fails to improve as anticipated.  I provided 16 minutes of non-face-to-face time during this encounter.   Karle Plumber, MD

## 2018-12-30 NOTE — Progress Notes (Signed)
Patient states that she saw [facility] a few weeks back. They think her prolapse requires more than their in office procedures so they are referring her to a gynecologist.  Hasn't been seen by [facility]. States that she was told in August that she's on a wait list.

## 2018-12-31 ENCOUNTER — Encounter: Payer: Self-pay | Admitting: Internal Medicine

## 2018-12-31 NOTE — Progress Notes (Signed)
I received notes from [facility].  Pt had nl cystoscopy. CT done 12/23/2018 revealed no acute findings in abd/pelvis, no urinary tract stones, small hiatal hernia. No hydronephrosis.

## 2019-01-08 ENCOUNTER — Telehealth: Payer: Self-pay | Admitting: *Deleted

## 2019-01-08 NOTE — Telephone Encounter (Signed)
Received a voice message from [place] stating she is calling for her mother Carolyn Bowen. States she has a question about the appointment that has been scheduled. States she doesn't know what the appointment is for or who scheduled it.  I called [name] and left a message I was calling back to answer her question that her daughter called about- please call us back and we are happy to explain about the appointment. Linda,RN

## 2019-01-09 NOTE — Telephone Encounter (Signed)
LM that this is our second attempt if she continues to have questions or concerns to please give [place] a call back.

## 2019-01-12 ENCOUNTER — Encounter: Payer: Medicare Other | Admitting: Obstetrics & Gynecology

## 2019-01-15 ENCOUNTER — Encounter: Payer: Medicare Other | Admitting: Obstetrics and Gynecology

## 2019-02-23 ENCOUNTER — Telehealth: Payer: Self-pay

## 2019-02-23 NOTE — Telephone Encounter (Signed)

## 2019-02-24 ENCOUNTER — Other Ambulatory Visit: Payer: Self-pay

## 2019-02-24 ENCOUNTER — Ambulatory Visit (INDEPENDENT_AMBULATORY_CARE_PROVIDER_SITE_OTHER): Payer: Medicare Other | Admitting: Internal Medicine

## 2019-02-24 VITALS — BP 165/74 | HR 65 | Temp 98.1°F | Resp 17 | Wt 142.4 lb

## 2019-02-24 DIAGNOSIS — D638 Anemia in other chronic diseases classified elsewhere: Secondary | ICD-10-CM | POA: Diagnosis not present

## 2019-02-24 DIAGNOSIS — I10 Essential (primary) hypertension: Secondary | ICD-10-CM

## 2019-02-24 DIAGNOSIS — E1122 Type 2 diabetes mellitus with diabetic chronic kidney disease: Secondary | ICD-10-CM

## 2019-02-24 DIAGNOSIS — N184 Chronic kidney disease, stage 4 (severe): Secondary | ICD-10-CM | POA: Diagnosis not present

## 2019-02-24 DIAGNOSIS — N951 Menopausal and female climacteric states: Secondary | ICD-10-CM

## 2019-02-24 DIAGNOSIS — N8110 Cystocele, unspecified: Secondary | ICD-10-CM

## 2019-02-24 DIAGNOSIS — L439 Lichen planus, unspecified: Secondary | ICD-10-CM | POA: Insufficient documentation

## 2019-02-24 MED ORDER — CARVEDILOL 3.125 MG PO TABS: 3.1250 mg | ORAL_TABLET | Freq: Two times a day (BID) | ORAL | 3 refills | Status: DC

## 2019-02-24 MED ORDER — FERROUS SULFATE 325 (65 FE) MG PO TBEC: 325.0000 mg | DELAYED_RELEASE_TABLET | Freq: Every day | ORAL | 3 refills | Status: DC

## 2019-02-24 NOTE — Patient Instructions (Addendum)
Changed to taking iron tablets just once a day.  Your blood pressure is not controlled.  The goal is 130/80 or lower.  Please record your blood pressure readings when you check them at home. Stop metoprolol.  We have changed it to 1 called carvedilol instead.  I think we can get you off Premarin.  Start taking 1 tablet every other week for 4 weeks then stop it.  Please touch base with your dermatologist regarding the medication Acitretin.  This may need to be discontinued due to your kidney function.

## 2019-02-24 NOTE — Progress Notes (Signed)
Patient ID: Carolyn Bowen, female    DOB: 11-29-1927  MRN: [id number]  CC: Diabetes, Hypertension, and Hypothyroidism   Subjective: [name] is a 85 y.o. female who presents for 6-7 wks follow up Her concerns today include:  With history of vaginal prolapse, DM, HTN, HL, hyperthyroidism, CKD 4, anemia of chronic disease.   CKD 4/ACD: pt has not seen the nephrologist as yet. States she was told to call back the first of 2021 because no appts available before then -taking Iron TID  Vaginal Prolapse:  Saw GYN Dr. Garwin Brothers x 2 since last visit.  Has pessary and it is working fine for her.  Takes Premarin 1.25 mg once a wk for "hot flashes."  She has been on this for 20 yrs.   Hematuria:  No further episodes.  I received notes from [facility].  Pt had nl cystoscopy. CT done 12/23/2018 revealed no acute findings in abd/pelvis, no urinary tract stones, small hiatal hernia. No hydronephrosis.  HTN:  Took meds already for today - Norvasc, metoprolol and Cozaar.  Checks BP once a wk with a wrist cuff but does not keep log.   -limits salt in foods.   No CP/SOB.  Intermittent LE edema.  Takes Furosemide as needed.  She tells me that she sees cardiologist Dr. Terrence Dupont  On Acitretin for a little over 1 yr for Lichen Planus.  Sees dermatologist Dr. Raj Janus.  Last seen 6 mths ago  DM:  Checks BS every morning.  Last 2 readings were 120, 148.  Low BS occasionally if she does not eat on time.  Can tell when BS is low.  She keeps glucose tabs with her.  On Amaryl 1 mg daily.  Does well with her eating habits. -Last eye exam was in December of last year.  She sees Dr. Einar Gip.  She states that she will have an appointment this month   Pt lives alone.  Drives sometimes.  Independent in ADLs.  Ambulates with a cane.  No recent falls.  Patient Active Problem List   Diagnosis Date Noted  . Lichen planus [id number]  . Anemia, chronic disease 12/30/2018  . CKD (chronic kidney disease)  stage 4, GFR 15-29 ml/min (HCC) 12/30/2018  . Vaginal prolapse 11/11/2018  . Controlled type 2 diabetes mellitus without complication, without long-term current use of insulin ([place]) 11/11/2018  . Hyperthyroidism 11/11/2018  . Vaginal bleeding 11/11/2018  . Hematuria, gross 11/11/2018  . Essential hypertension 09/06/2014     Current Outpatient Medications on File Prior to Visit  Medication Sig Dispense Refill  . acitretin (SORIATANE) 10 MG capsule 1 CAPSULE WITH A MEAL ONCE A DAY ORALLY 30    . amLODipine (NORVASC) 5 MG tablet Take 1 tablet by mouth daily.    Marland Kitchen aspirin 81 MG chewable tablet Chew 81 mg by mouth daily.    Marland Kitchen atorvastatin (LIPITOR) 20 MG tablet Take 1 tablet by mouth daily.    Marland Kitchen esomeprazole (NEXIUM) 20 MG capsule Take 20 mg by mouth daily as needed (heartburn).    Marland Kitchen estrogens, conjugated, (PREMARIN) 1.25 MG tablet Take 1 tablet by mouth once a week.    . fexofenadine (ALLEGRA) 180 MG tablet Take 180 mg by mouth daily.    . furosemide (LASIX) 40 MG tablet Take 1 tablet by mouth daily as needed.    Marland Kitchen glimepiride (AMARYL) 1 MG tablet Take 1 tablet by mouth daily.    Marland Kitchen losartan (COZAAR) 100 MG tablet Take 1 tablet  by mouth daily.    . methimazole (TAPAZOLE) 10 MG tablet Take 0.5 tablets by mouth 4 (four) times a week.      No current facility-administered medications on file prior to visit.     Allergies  Allergen Reactions  . Codeine   . Penicillins Hives    Social History   Socioeconomic History  . Marital status: Widowed    Spouse name: Not on file  . Number of children: Not on file  . Years of education: Not on file  . Highest education level: Not on file  Occupational History  . Not on file  Social Needs  . Financial resource strain: Not on file  . Food insecurity    Worry: Not on file    Inability: Not on file  . Transportation needs    Medical: Not on file    Non-medical: Not on file  Tobacco Use  . Smoking status: Never Smoker  . Smokeless tobacco:  Never Used  Substance and Sexual Activity  . Alcohol use: No    Alcohol/week: 0.0 standard drinks  . Drug use: No  . Sexual activity: Not on file  Lifestyle  . Physical activity    Days per week: Not on file    Minutes per session: Not on file  . Stress: Not on file  Relationships  . Social Herbalist on phone: Not on file    Gets together: Not on file    Attends religious service: Not on file    Active member of club or organization: Not on file    Attends meetings of clubs or organizations: Not on file    Relationship status: Not on file  . Intimate partner violence    Fear of current or ex partner: Not on file    Emotionally abused: Not on file    Physically abused: Not on file    Forced sexual activity: Not on file  Other Topics Concern  . Not on file  Social History Narrative  . Not on file    Family History  Problem Relation Age of Onset  . Heart disease Mother   . Cancer Father   . Heart disease Brother   . Breast cancer Daughter     Past Surgical History:  Procedure Laterality Date  . ABDOMINAL HYSTERECTOMY    . APPENDECTOMY    . BREAST BIOPSY Right 2009  . BREAST EXCISIONAL BIOPSY Right 2002  . CATARACT EXTRACTION  2019  . HERNIA REPAIR      ROS: Review of Systems Negative except as stated above  PHYSICAL EXAM: BP (!) 165/74   Pulse 65   Temp 98.1 F (36.7 C) (Temporal)   Resp 17   Wt 142 lb 6.4 oz (64.6 kg)   SpO2 98%   BMI 25.63 kg/m   Wt Readings from Last 3 Encounters:  02/24/19 142 lb 6.4 oz (64.6 kg)  11/11/18 145 lb 12.8 oz (66.1 kg)  08/08/15 159 lb (72.1 kg)   repeat BP 169/76  Physical Exam  General appearance -pleasant elderly female in NAD Mental status - normal mood, behavior, speech, dress, motor activity, and thought processes Eyes - pupils equal and reactive, extraocular eye movements intact Neck - supple, no significant adenopathy Chest - clear to auscultation, no wheezes, rales or rhonchi, symmetric air  entry Heart - normal rate, regular rhythm, normal S1, S2, no murmurs, rubs, clicks or gallops Extremities - trace to 1 + LE edema Skin: blouched hyperpigmentation on  RUE CMP Latest Ref Rng & Units 11/11/2018 12/28/2010  Glucose 65 - 99 mg/dL 124(H) 170(H)  BUN 8 - 27 mg/dL 39(H) -  Creatinine 0.57 - 1.00 mg/dL 2.12(H) -  Sodium 134 - 144 mmol/L 140 -  Potassium 3.5 - 5.2 mmol/L 4.1 -  Chloride 96 - 106 mmol/L 105 -  CO2 20 - 29 mmol/L 19(L) -  Calcium 8.7 - 10.3 mg/dL 9.9 -  Total Protein 6.0 - 8.5 g/dL 8.1 -  Total Bilirubin 0.0 - 1.2 mg/dL 0.3 -  Alkaline Phos 39 - 117 IU/L 87 -  AST 0 - 40 IU/L 19 -  ALT 0 - 32 IU/L 18 -   Lipid Panel     Component Value Date/Time   CHOL 133 11/11/2018 1245   TRIG 134 11/11/2018 1245   HDL 55 11/11/2018 1245   CHOLHDL 2.4 11/11/2018 1245   LDLCALC 51 11/11/2018 1245    CBC    Component Value Date/Time   WBC 8.8 11/11/2018 1245   WBC 7.7 09/06/2014 1033   RBC 3.54 (L) 11/11/2018 1245   RBC 4.23 09/06/2014 1033   HGB 9.7 (L) 11/11/2018 1245   HCT 29.7 (L) 11/11/2018 1245   PLT 271 11/11/2018 1245   MCV 84 11/11/2018 1245   MCH 27.4 11/11/2018 1245   MCH 28.8 09/06/2014 1033   MCHC 32.7 11/11/2018 1245   MCHC 32.5 09/06/2014 1033   RDW 13.6 11/11/2018 1245    ASSESSMENT AND PLAN:  1. Controlled type 2 diabetes mellitus with stage 4 chronic kidney disease, without long-term current use of insulin ([place]) So far patient seems to be doing okay on low-dose sulfonylurea.  However in the future we may need to change this given her degree of CKD. - Hemoglobin A1c  2. Essential hypertension Not at goal.  Will change metoprolol to carvedilol for better blood pressure lowering effects. Advised patient to record blood pressure readings.  Goal is 130/80 or lower.  I have also placed this on her discharge summary to remind her. -Amlodipine dose was not increased because she has lower extremity edema already. - carvedilol (COREG) 3.125 MG  tablet; Take 1 tablet (3.125 mg total) by mouth 2 (two) times daily with a meal. Stop Metoprolol  Dispense: 60 tablet; Refill: 3  3. CKD (chronic kidney disease) stage 4, GFR 15-29 ml/min (HCC) I will have our referral coordinator reach out to [facility] to see if they can get her in much sooner than next year.  Referral placed in 10/2018 -Advised patient to touch base with her dermatologist about stopping or changing the med Acitretin to something different.  According to UpToDate, this may be contraindicated in pts with advance kidney disease.  She plans to call her dermatologist today - Basic Metabolic Panel - Microalbumin / creatinine urine ratio  4. Anemia, chronic disease Dec iron supplement to once daily dosing - CBC - ferrous sulfate 325 (65 FE) MG EC tablet; Take 1 tablet (325 mg total) by mouth daily with breakfast.  Dispense: 100 tablet; Refill: 3  5. Lichen planus See #3 above  6. Vaginal prolapse Has seen derm.  Tolerating pessary  7. Hot flash, menopausal Given her age, I think we can stop Premarin.  She is taking it just once a week.  Patient concerned about stopping it abruptly told her to take it once a week for the next 4 weeks then stop the medication.  She sees endocrinologist for her history of hyperthyroidism and TSH checked there.  Patient was given the opportunity to ask questions.  Patient verbalized understanding of the plan and was able to repeat key elements of the plan.   Orders Placed This Encounter  Procedures  . Hemoglobin A1c  . CBC  . Basic Metabolic Panel  . Microalbumin / creatinine urine ratio     Requested Prescriptions   Signed Prescriptions Disp Refills  . carvedilol (COREG) 3.125 MG tablet 60 tablet 3    Sig: Take 1 tablet (3.125 mg total) by mouth 2 (two) times daily with a meal. Stop Metoprolol  . ferrous sulfate 325 (65 FE) MG EC tablet 100 tablet 3    Sig: Take 1 tablet (325 mg total) by mouth daily with breakfast.     Return in about 3 months (around 05/25/2019).  Karle Plumber, MD, FACP

## 2019-02-24 NOTE — Progress Notes (Signed)
Patient is not fasting.  States that fasting FSBS readings have been in the 120s. Denies nausea, vomiting, polyuria, polydipsia, numbness/tingling in feet.  Doesn't check BP at home but states that readings have been high at other doctors offices. Denies chest pain, SHOB, headaches, palpitations, dizziness, lower extremity swelling.

## 2019-02-25 ENCOUNTER — Telehealth: Payer: Self-pay | Admitting: Internal Medicine

## 2019-02-25 ENCOUNTER — Encounter: Payer: Self-pay | Admitting: Internal Medicine

## 2019-02-25 DIAGNOSIS — R809 Proteinuria, unspecified: Secondary | ICD-10-CM | POA: Insufficient documentation

## 2019-02-25 LAB — MICROALBUMIN / CREATININE URINE RATIO
Creatinine, Urine: 93.9 mg/dL
Microalb/Creat Ratio: 140 mg/g creat — ABNORMAL HIGH (ref 0–29)
Microalbumin, Urine: 131.3 ug/mL

## 2019-02-25 LAB — HEMOGLOBIN A1C
Est. average glucose Bld gHb Est-mCnc: 140 mg/dL
Hgb A1c MFr Bld: 6.5 % — ABNORMAL HIGH (ref 4.8–5.6)

## 2019-02-25 LAB — BASIC METABOLIC PANEL
BUN/Creatinine Ratio: 14 (ref 12–28)
BUN: 26 mg/dL (ref 8–27)
CO2: 24 mmol/L (ref 20–29)
Calcium: 10.2 mg/dL (ref 8.7–10.3)
Chloride: 108 mmol/L — ABNORMAL HIGH (ref 96–106)
Creatinine, Ser: 1.88 mg/dL — ABNORMAL HIGH (ref 0.57–1.00)
GFR calc Af Amer: 27 mL/min/{1.73_m2} — ABNORMAL LOW (ref >59)
GFR calc non Af Amer: 23 mL/min/{1.73_m2} — ABNORMAL LOW (ref >59)
Glucose: 60 mg/dL — ABNORMAL LOW (ref 65–99)
Potassium: 5.2 mmol/L (ref 3.5–5.2)
Sodium: 145 mmol/L — ABNORMAL HIGH (ref 134–144)

## 2019-02-25 LAB — CBC
Hematocrit: 39.9 % (ref 34.0–46.6)
Hemoglobin: 12.3 g/dL (ref 11.1–15.9)
MCH: 28.1 pg (ref 26.6–33.0)
MCHC: 30.8 g/dL — ABNORMAL LOW (ref 31.5–35.7)
MCV: 91 fL (ref 79–97)
Platelets: 142 10*3/uL — ABNORMAL LOW (ref 150–450)
RBC: 4.37 x10E6/uL (ref 3.77–5.28)
RDW: 15.4 % (ref 11.7–15.4)
WBC: 6.6 10*3/uL (ref 3.4–10.8)

## 2019-02-25 NOTE — Telephone Encounter (Signed)
PC placed to pt today. Pt informed that her A1C is 6.5 which means her DM is well controlled.  However, I advise pt to stop Glimepiride and call me if BS start falling below 100.  She reports BS this a.m before BF was 159. Informed that anemia has corrected.  I recommend holding iron supplement for 1 mth then restart taking one tab daily.  PLT count is slightly low.  Will observe for now. Kidney function still in the range for CKD 4.  My CMA will be contacting [facility] to see if they can get her in before the new yr.  Pt wrote down instructions about BS and stopping the iron and was able to repeat instructions back to me correctly. Results for orders placed or performed in visit on 02/24/19  Hemoglobin A1c  Result Value Ref Range   Hgb A1c MFr Bld 6.5 (H) 4.8 - 5.6 %   Est. average glucose Bld gHb Est-mCnc 140 mg/dL  CBC  Result Value Ref Range   WBC 6.6 3.4 - 10.8 x10E3/uL   RBC 4.37 3.77 - 5.28 x10E6/uL   Hemoglobin 12.3 11.1 - 15.9 g/dL   Hematocrit 39.9 34.0 - 46.6 %   MCV 91 79 - 97 fL   MCH 28.1 26.6 - 33.0 pg   MCHC 30.8 (L) 31.5 - 35.7 g/dL   RDW 15.4 11.7 - 15.4 %   Platelets 142 (L) 150 - 450 [id number]  Basic Metabolic Panel  Result Value Ref Range   Glucose 60 (L) 65 - 99 mg/dL   BUN 26 8 - 27 mg/dL   Creatinine, Ser 1.88 (H) 0.57 - 1.00 mg/dL   GFR calc non Af Amer 23 (L) >59 mL/min/1.73   GFR calc Af Amer 27 (L) >59 mL/min/1.73   BUN/Creatinine Ratio 14 12 - 28   Sodium 145 (H) 134 - 144 mmol/L   Potassium 5.2 3.5 - 5.2 mmol/L   Chloride 108 (H) 96 - 106 mmol/L   CO2 24 20 - 29 mmol/L   Calcium 10.2 8.7 - 10.3 mg/dL

## 2019-03-25 DIAGNOSIS — Z23 Encounter for immunization: Secondary | ICD-10-CM | POA: Diagnosis not present

## 2019-03-25 DIAGNOSIS — L439 Lichen planus, unspecified: Secondary | ICD-10-CM | POA: Diagnosis not present

## 2019-03-25 DIAGNOSIS — L2084 Intrinsic (allergic) eczema: Secondary | ICD-10-CM | POA: Diagnosis not present

## 2019-04-06 DIAGNOSIS — E785 Hyperlipidemia, unspecified: Secondary | ICD-10-CM | POA: Diagnosis not present

## 2019-04-06 DIAGNOSIS — E1151 Type 2 diabetes mellitus with diabetic peripheral angiopathy without gangrene: Secondary | ICD-10-CM | POA: Diagnosis not present

## 2019-04-06 DIAGNOSIS — I10 Essential (primary) hypertension: Secondary | ICD-10-CM | POA: Diagnosis not present

## 2019-04-06 DIAGNOSIS — N189 Chronic kidney disease, unspecified: Secondary | ICD-10-CM | POA: Diagnosis not present

## 2019-04-06 DIAGNOSIS — I2510 Atherosclerotic heart disease of native coronary artery without angina pectoris: Secondary | ICD-10-CM | POA: Diagnosis not present

## 2019-04-06 DIAGNOSIS — E0590 Thyrotoxicosis, unspecified without thyrotoxic crisis or storm: Secondary | ICD-10-CM | POA: Diagnosis not present

## 2019-04-16 DIAGNOSIS — E1122 Type 2 diabetes mellitus with diabetic chronic kidney disease: Secondary | ICD-10-CM | POA: Diagnosis not present

## 2019-04-16 DIAGNOSIS — N184 Chronic kidney disease, stage 4 (severe): Secondary | ICD-10-CM | POA: Diagnosis not present

## 2019-04-16 DIAGNOSIS — N189 Chronic kidney disease, unspecified: Secondary | ICD-10-CM | POA: Diagnosis not present

## 2019-04-16 DIAGNOSIS — D631 Anemia in chronic kidney disease: Secondary | ICD-10-CM | POA: Diagnosis not present

## 2019-04-16 DIAGNOSIS — I129 Hypertensive chronic kidney disease with stage 1 through stage 4 chronic kidney disease, or unspecified chronic kidney disease: Secondary | ICD-10-CM | POA: Diagnosis not present

## 2019-04-20 ENCOUNTER — Other Ambulatory Visit: Payer: Self-pay | Admitting: Nephrology

## 2019-04-20 DIAGNOSIS — N184 Chronic kidney disease, stage 4 (severe): Secondary | ICD-10-CM

## 2019-04-29 ENCOUNTER — Other Ambulatory Visit: Payer: Self-pay | Admitting: Internal Medicine

## 2019-04-29 DIAGNOSIS — N816 Rectocele: Secondary | ICD-10-CM | POA: Diagnosis not present

## 2019-04-29 DIAGNOSIS — N8111 Cystocele, midline: Secondary | ICD-10-CM | POA: Diagnosis not present

## 2019-05-04 ENCOUNTER — Ambulatory Visit
Admission: RE | Admit: 2019-05-04 | Discharge: 2019-05-04 | Disposition: A | Discharge status: Home / Self Care | Payer: Medicare Other | Source: Ambulatory Visit | Attending: Nephrology | Admitting: Nephrology

## 2019-05-04 DIAGNOSIS — N184 Chronic kidney disease, stage 4 (severe): Secondary | ICD-10-CM

## 2019-05-04 DIAGNOSIS — N189 Chronic kidney disease, unspecified: Secondary | ICD-10-CM | POA: Diagnosis not present

## 2019-05-26 ENCOUNTER — Ambulatory Visit: Payer: Medicare Other | Admitting: Internal Medicine

## 2019-05-27 ENCOUNTER — Ambulatory Visit (INDEPENDENT_AMBULATORY_CARE_PROVIDER_SITE_OTHER): Payer: Medicare Other | Admitting: Internal Medicine

## 2019-05-27 ENCOUNTER — Encounter: Payer: Self-pay | Admitting: Internal Medicine

## 2019-05-27 DIAGNOSIS — I10 Essential (primary) hypertension: Secondary | ICD-10-CM | POA: Diagnosis not present

## 2019-05-27 DIAGNOSIS — Z7984 Long term (current) use of oral hypoglycemic drugs: Secondary | ICD-10-CM | POA: Diagnosis not present

## 2019-05-27 DIAGNOSIS — E119 Type 2 diabetes mellitus without complications: Secondary | ICD-10-CM

## 2019-05-27 NOTE — Progress Notes (Signed)
Virtual Visit via Telephone Note  I connected with Carolyn Bowen, on 05/27/2019 at 10:12 AM by telephone due to the COVID-19 pandemic and verified that I am speaking with the correct person using two identifiers.   Consent: I discussed the limitations, risks, security and privacy concerns of performing an evaluation and management service by telephone and the availability of in person appointments. I also discussed with the patient that there may be a patient responsible charge related to this service. The patient expressed understanding and agreed to proceed.   Location of Patient: Home   Location of Provider: Clinic    Persons participating in Telemedicine visit: Dezaree Tr[facility] Dr. [name]      History of Present Illness: Patient has a visit to follow up on chronic medical conditions.   Diabetes mellitus, Type 2 Disease Monitoring             Blood Sugar Ranges: Fasting - 149 this AM (this is consistent with averages over the past couple weeks)              Polyuria: no              Visual problems: no   Urine Microalbumin Elevated, on ARB therapy   Last A1C: 6.5 (Dec 2020)   Medications: Amaryl 1 mg  Medication Compliance: yes  Medication Side Effects             Hypoglycemia: reports it will happen if she misses a meal, carries a glucose pill in her pocket at all times     Chronic HTN Disease Monitoring:  Home BP Monitoring -  142/65 this AM  Chest pain- no  Dyspnea- no Headache - no  Medications: Amlodipine 5 mg, Coreg 3.125 BID, Lasix 40 mg, Cozaar 100 mg  Compliance- yes Lightheadedness- no  Edema- no       Past Medical History:  Diagnosis Date  . Abdominal pain   . Diabetes mellitus without complication ([place])   . Hypertension   . Thyroid disease    Allergies  Allergen Reactions  . Codeine   . Penicillins Hives    Current Outpatient Medications on File Prior to Visit  Medication Sig Dispense Refill  .  acitretin (SORIATANE) 10 MG capsule 1 CAPSULE WITH A MEAL ONCE A DAY ORALLY 30    . amLODipine (NORVASC) 5 MG tablet Take 1 tablet by mouth daily.    Marland Kitchen aspirin 81 MG chewable tablet Chew 81 mg by mouth daily.    Marland Kitchen atorvastatin (LIPITOR) 20 MG tablet Take 1 tablet by mouth daily.    . carvedilol (COREG) 3.125 MG tablet Take 1 tablet (3.125 mg total) by mouth 2 (two) times daily with a meal. Stop Metoprolol 60 tablet 3  . [employer] D3 50 MCG (2000 UT) CAPS Take 1 capsule by mouth daily.    Marland Kitchen esomeprazole (NEXIUM) 20 MG capsule Take 20 mg by mouth daily as needed (heartburn).    . ferrous sulfate 325 (65 FE) MG EC tablet Take 1 tablet (325 mg total) by mouth daily with breakfast. 100 tablet 3  . fexofenadine (ALLEGRA) 180 MG tablet Take 180 mg by mouth daily.    . furosemide (LASIX) 40 MG tablet Take 1 tablet by mouth daily as needed.    Marland Kitchen glimepiride (AMARYL) 1 MG tablet Take 1 tablet by mouth daily.    Marland Kitchen losartan (COZAAR) 100 MG tablet Take 1 tablet by mouth daily.    . methimazole (TAPAZOLE) 10 MG  tablet Take 0.5 tablets by mouth 4 (four) times a week.      No current facility-administered medications on file prior to visit.    Observations/Objective: NAD. Speaking clearly.  Work of breathing normal.  Alert and oriented. Mood appropriate.   Assessment and Plan: 1. Essential hypertension Slightly above goal. But given age, would not aim for tight control. Continue current therapy.  Counseled on blood pressure goal of less than 130/80, low-sodium, DASH diet, medication compliance, 150 minutes of moderate intensity exercise per week. Discussed medication compliance, adverse effects.  2. Controlled type 2 diabetes mellitus without complication, without long-term current use of insulin (HCC) Last A1c actually low for age. No low fasting CBGs so will continue low dose Amaryl for now. Patient has a good protocol in place for hypoglycemia and is able to self identify symptoms. If A1c remains <7 at  next visit, would likely d/c DM therapy.    Follow Up Instructions: 3 months for DM and HTN    I discussed the assessment and treatment plan with the patient. The patient was provided an opportunity to ask questions and all were answered. The patient agreed with the plan and demonstrated an understanding of the instructions.   The patient was advised to call back or seek an in-person evaluation if the symptoms worsen or if the condition fails to improve as anticipated.     I provided 14 minutes total of non-face-to-face time during this encounter including median intraservice time, reviewing previous notes, investigations, ordering medications, medical decision making, coordinating care and patient verbalized understanding at the end of the visit.    Phill Myron, D.O. Primary Care at [facility]  05/27/2019, 10:12 AM

## 2019-06-11 DIAGNOSIS — H53002 Unspecified amblyopia, left eye: Secondary | ICD-10-CM | POA: Diagnosis not present

## 2019-06-11 DIAGNOSIS — E119 Type 2 diabetes mellitus without complications: Secondary | ICD-10-CM | POA: Diagnosis not present

## 2019-06-11 DIAGNOSIS — Z961 Presence of intraocular lens: Secondary | ICD-10-CM | POA: Diagnosis not present

## 2019-06-11 DIAGNOSIS — H52221 Regular astigmatism, right eye: Secondary | ICD-10-CM | POA: Diagnosis not present

## 2019-06-11 DIAGNOSIS — I10 Essential (primary) hypertension: Secondary | ICD-10-CM | POA: Diagnosis not present

## 2019-07-06 DIAGNOSIS — I10 Essential (primary) hypertension: Secondary | ICD-10-CM | POA: Diagnosis not present

## 2019-07-06 DIAGNOSIS — M1990 Unspecified osteoarthritis, unspecified site: Secondary | ICD-10-CM | POA: Diagnosis not present

## 2019-07-06 DIAGNOSIS — E785 Hyperlipidemia, unspecified: Secondary | ICD-10-CM | POA: Diagnosis not present

## 2019-07-06 DIAGNOSIS — N189 Chronic kidney disease, unspecified: Secondary | ICD-10-CM | POA: Diagnosis not present

## 2019-07-06 DIAGNOSIS — E1151 Type 2 diabetes mellitus with diabetic peripheral angiopathy without gangrene: Secondary | ICD-10-CM | POA: Diagnosis not present

## 2019-07-06 DIAGNOSIS — E0590 Thyrotoxicosis, unspecified without thyrotoxic crisis or storm: Secondary | ICD-10-CM | POA: Diagnosis not present

## 2019-07-12 ENCOUNTER — Other Ambulatory Visit: Payer: Self-pay | Admitting: Internal Medicine

## 2019-07-12 DIAGNOSIS — I10 Essential (primary) hypertension: Secondary | ICD-10-CM

## 2019-07-23 DIAGNOSIS — N816 Rectocele: Secondary | ICD-10-CM | POA: Diagnosis not present

## 2019-07-23 DIAGNOSIS — Z4689 Encounter for fitting and adjustment of other specified devices: Secondary | ICD-10-CM | POA: Diagnosis not present

## 2019-07-23 DIAGNOSIS — N8111 Cystocele, midline: Secondary | ICD-10-CM | POA: Diagnosis not present

## 2019-08-03 DIAGNOSIS — N184 Chronic kidney disease, stage 4 (severe): Secondary | ICD-10-CM | POA: Diagnosis not present

## 2019-08-03 LAB — IRON AND TIBC
Iron Bind.Cap.(Total): 276
Iron Saturation: 29
Iron: 80
UIBC: 196

## 2019-08-03 LAB — RENAL FUNCTION PANEL
Albumin, Serum: 4.6
BUN/Creatinine Ratio: 16
BUN: 33 — AB (ref 4–21)
Calcium: 10.2
Carbon Dioxide, Total: 26
Chloride: 109
Creatine, Serum: 2.08
EGFR (African American): 24
Glucose: 132
Hemoglobin: 12.2
Phosphorus, Ser: 3.5
Potassium: 5
Sodium: 144

## 2019-08-03 LAB — FERRITIN, SERUM (SERIAL): Ferritin: 351

## 2019-08-03 LAB — PTH, INTACT: PTH, Intact: 39

## 2019-08-03 LAB — VITAMIN D 25 HYDROXY (VIT D DEFICIENCY, FRACTURES): Vit D, 25-Hydroxy: 44.4

## 2019-08-12 DIAGNOSIS — N184 Chronic kidney disease, stage 4 (severe): Secondary | ICD-10-CM | POA: Diagnosis not present

## 2019-08-12 DIAGNOSIS — E559 Vitamin D deficiency, unspecified: Secondary | ICD-10-CM | POA: Diagnosis not present

## 2019-08-12 DIAGNOSIS — I129 Hypertensive chronic kidney disease with stage 1 through stage 4 chronic kidney disease, or unspecified chronic kidney disease: Secondary | ICD-10-CM | POA: Diagnosis not present

## 2019-08-12 DIAGNOSIS — D631 Anemia in chronic kidney disease: Secondary | ICD-10-CM | POA: Diagnosis not present

## 2019-08-26 ENCOUNTER — Telehealth: Payer: Self-pay

## 2019-08-26 NOTE — Telephone Encounter (Signed)
Called patient to do their pre-visit COVID screening.  Call rang & rang until phone hung up. Unable to leave voicemail or do prescreening.

## 2019-08-27 ENCOUNTER — Encounter: Payer: Self-pay | Admitting: Internal Medicine

## 2019-08-27 ENCOUNTER — Other Ambulatory Visit: Payer: Self-pay

## 2019-08-27 ENCOUNTER — Ambulatory Visit (INDEPENDENT_AMBULATORY_CARE_PROVIDER_SITE_OTHER): Payer: Medicare Other | Admitting: Internal Medicine

## 2019-08-27 VITALS — BP 130/67 | HR 64 | Temp 96.4°F | Resp 17 | Wt 148.0 lb

## 2019-08-27 DIAGNOSIS — E1122 Type 2 diabetes mellitus with diabetic chronic kidney disease: Secondary | ICD-10-CM | POA: Diagnosis not present

## 2019-08-27 DIAGNOSIS — N184 Chronic kidney disease, stage 4 (severe): Secondary | ICD-10-CM | POA: Diagnosis not present

## 2019-08-27 DIAGNOSIS — Z862 Personal history of diseases of the blood and blood-forming organs and certain disorders involving the immune mechanism: Secondary | ICD-10-CM | POA: Diagnosis not present

## 2019-08-27 DIAGNOSIS — I10 Essential (primary) hypertension: Secondary | ICD-10-CM

## 2019-08-27 LAB — POCT GLYCOSYLATED HEMOGLOBIN (HGB A1C): Hemoglobin A1C: 6.3 % — AB (ref 4.0–5.6)

## 2019-08-27 LAB — GLUCOSE, POCT (MANUAL RESULT ENTRY): POC Glucose: 148 mg/dl — AB (ref 70–99)

## 2019-08-27 NOTE — Progress Notes (Signed)
Subjective:    Carolyn Bowen - 86 y.o. female MRN [id number]  Date of birth: February 12, 1928  HPI  Carolyn Bowen is here for f/u of chronic medical conditons.  Diabetes mellitus, Type 2 Disease Monitoring             Blood Sugar Ranges: Fasting -117 this AM, this is a typical range for her              Polyuria: no              Visual problems: no   Urine Microalbumin Elevated to 140 in Dec 2020. On Arb.   Last A1C: 6.5 (Dec 2020)   Medications: Amaryl 1 mg  Medication Compliance: yes  Medication Side Effects             Hypoglycemia: yes, reports this occurs when she is late to eating a meal. Carries glucose tablet with her.    Chronic HTN Disease Monitoring:  Home BP Monitoring - 120/70s (including this AM) Chest pain- no  Dyspnea- no Headache - no  Medications: Amlodipine 10 mg, Coreg 3.125 mg BID, Losartan 100 mg  Compliance- yes Lightheadedness- no  Edema- no       Health Maintenance:  Health Maintenance Due  Topic Date Due  . OPHTHALMOLOGY EXAM  Never done  . COVID-19 Vaccine (1) Never done  . PNA vac Low Risk Adult (1 of 2 - PCV13) Never done    -  reports that she has never smoked. She has never used smokeless tobacco. - Review of Systems: Per HPI. - Past Medical History: Patient Active Problem List   Diagnosis Date Noted  . Positive for microalbuminuria 02/25/2019  . Lichen planus 40/98/1191  . Anemia, chronic disease 12/30/2018  . CKD (chronic kidney disease) stage 4, GFR 15-29 ml/min (HCC) 12/30/2018  . Vaginal prolapse 11/11/2018  . Controlled type 2 diabetes mellitus without complication, without long-term current use of insulin ([place]) 11/11/2018  . Hyperthyroidism 11/11/2018  . Vaginal bleeding 11/11/2018  . Hematuria, gross 11/11/2018  . Essential hypertension 09/06/2014   - Medications: reviewed and updated   Objective:   Physical Exam BP 130/67   Pulse 64   Temp (!) 96.4 F (35.8 C) (Temporal)   Resp 17   Wt 148  lb (67.1 kg)   SpO2 97%   BMI 26.64 kg/m  Physical Exam Constitutional:      General: She is not in acute distress.    Appearance: She is not diaphoretic.  HENT:     Head: Normocephalic and atraumatic.  Eyes:     Conjunctiva/sclera: Conjunctivae normal.  Cardiovascular:     Rate and Rhythm: Normal rate and regular rhythm.     Heart sounds: Normal heart sounds. No murmur heard.   Pulmonary:     Effort: Pulmonary effort is normal. No respiratory distress.     Breath sounds: Normal breath sounds.  Musculoskeletal:        General: Normal range of motion.     Comments: Diabetic Foot Check -  Appearance - no lesions, ulcers or calluses Skin - no unusual pallor or redness Monofilament testing - normal bilaterally  Right - Great toe, medial, central, lateral ball and posterior foot intact Left - Great toe, medial, central, lateral ball and posterior foot intact   Skin:    General: Skin is warm and dry.  Neurological:     Mental Status: She is alert and oriented to person, place, and time.  Psychiatric:  Mood and Affect: Affect normal.        Judgment: Judgment normal.            Assessment & Plan:   1. Controlled type 2 diabetes mellitus with stage 4 chronic kidney disease, without long-term current use of insulin (HCC) A1c 6.3%. Given age and risk of hypoglycemia as well as controlled glucose, will discontinue Amaryl. Follow up A1c in 6 months.  - Glucose (CBG) - HgB A1c - HM Diabetes Foot Exam - Ambulatory referral to Ophthalmology  2. Essential hypertension BP well controlled at office and at home. Continue current regimen.   3. History of anemia Took Fe supplements for about 6 months. Repeat HgB in Dec 2020 was within normal limits. Nephrology saw patient in May and obtained labs. Reviewed these from [place] and HgB stable around 12 with normal iron levels.      Phill Myron, D.O. 08/27/2019, 9:56 AM Primary Care at [facility]

## 2019-09-15 ENCOUNTER — Other Ambulatory Visit: Payer: Self-pay | Admitting: Internal Medicine

## 2019-09-15 ENCOUNTER — Other Ambulatory Visit: Payer: Self-pay | Admitting: Cardiology

## 2019-09-15 DIAGNOSIS — Z1231 Encounter for screening mammogram for malignant neoplasm of breast: Secondary | ICD-10-CM

## 2019-09-28 DIAGNOSIS — E0590 Thyrotoxicosis, unspecified without thyrotoxic crisis or storm: Secondary | ICD-10-CM | POA: Diagnosis not present

## 2019-09-28 DIAGNOSIS — E785 Hyperlipidemia, unspecified: Secondary | ICD-10-CM | POA: Diagnosis not present

## 2019-09-28 DIAGNOSIS — I10 Essential (primary) hypertension: Secondary | ICD-10-CM | POA: Diagnosis not present

## 2019-09-28 DIAGNOSIS — E119 Type 2 diabetes mellitus without complications: Secondary | ICD-10-CM | POA: Diagnosis not present

## 2019-10-05 DIAGNOSIS — D649 Anemia, unspecified: Secondary | ICD-10-CM | POA: Diagnosis not present

## 2019-10-05 DIAGNOSIS — E1151 Type 2 diabetes mellitus with diabetic peripheral angiopathy without gangrene: Secondary | ICD-10-CM | POA: Diagnosis not present

## 2019-10-05 DIAGNOSIS — I10 Essential (primary) hypertension: Secondary | ICD-10-CM | POA: Diagnosis not present

## 2019-10-05 DIAGNOSIS — E0590 Thyrotoxicosis, unspecified without thyrotoxic crisis or storm: Secondary | ICD-10-CM | POA: Diagnosis not present

## 2019-10-05 DIAGNOSIS — E785 Hyperlipidemia, unspecified: Secondary | ICD-10-CM | POA: Diagnosis not present

## 2019-10-08 DIAGNOSIS — E1165 Type 2 diabetes mellitus with hyperglycemia: Secondary | ICD-10-CM | POA: Diagnosis not present

## 2019-10-08 DIAGNOSIS — E7800 Pure hypercholesterolemia, unspecified: Secondary | ICD-10-CM | POA: Diagnosis not present

## 2019-10-08 DIAGNOSIS — N182 Chronic kidney disease, stage 2 (mild): Secondary | ICD-10-CM | POA: Diagnosis not present

## 2019-10-08 DIAGNOSIS — I10 Essential (primary) hypertension: Secondary | ICD-10-CM | POA: Diagnosis not present

## 2019-10-08 DIAGNOSIS — E0590 Thyrotoxicosis, unspecified without thyrotoxic crisis or storm: Secondary | ICD-10-CM | POA: Diagnosis not present

## 2019-10-13 ENCOUNTER — Other Ambulatory Visit: Payer: Self-pay

## 2019-10-13 ENCOUNTER — Ambulatory Visit
Admission: RE | Admit: 2019-10-13 | Discharge: 2019-10-13 | Disposition: A | Discharge status: Home / Self Care | Payer: Medicare Other | Source: Ambulatory Visit | Attending: Internal Medicine | Admitting: Internal Medicine

## 2019-10-13 DIAGNOSIS — Z1231 Encounter for screening mammogram for malignant neoplasm of breast: Secondary | ICD-10-CM

## 2019-11-02 DIAGNOSIS — Z4689 Encounter for fitting and adjustment of other specified devices: Secondary | ICD-10-CM | POA: Diagnosis not present

## 2019-11-02 DIAGNOSIS — N816 Rectocele: Secondary | ICD-10-CM | POA: Diagnosis not present

## 2019-11-02 DIAGNOSIS — N8111 Cystocele, midline: Secondary | ICD-10-CM | POA: Diagnosis not present

## 2020-01-04 DIAGNOSIS — I10 Essential (primary) hypertension: Secondary | ICD-10-CM | POA: Diagnosis not present

## 2020-01-04 DIAGNOSIS — D649 Anemia, unspecified: Secondary | ICD-10-CM | POA: Diagnosis not present

## 2020-01-04 DIAGNOSIS — M1990 Unspecified osteoarthritis, unspecified site: Secondary | ICD-10-CM | POA: Diagnosis not present

## 2020-01-04 DIAGNOSIS — E0590 Thyrotoxicosis, unspecified without thyrotoxic crisis or storm: Secondary | ICD-10-CM | POA: Diagnosis not present

## 2020-01-04 DIAGNOSIS — E1151 Type 2 diabetes mellitus with diabetic peripheral angiopathy without gangrene: Secondary | ICD-10-CM | POA: Diagnosis not present

## 2020-01-04 DIAGNOSIS — E785 Hyperlipidemia, unspecified: Secondary | ICD-10-CM | POA: Diagnosis not present

## 2020-01-06 ENCOUNTER — Telehealth: Payer: Self-pay

## 2020-01-06 DIAGNOSIS — I10 Essential (primary) hypertension: Secondary | ICD-10-CM

## 2020-01-06 MED ORDER — AMLODIPINE BESYLATE 10 MG PO TABS: 10.0000 mg | ORAL_TABLET | Freq: Every day | ORAL | 0 refills | Status: DC

## 2020-01-06 MED ORDER — CARVEDILOL 3.125 MG PO TABS: 3.1250 mg | ORAL_TABLET | Freq: Two times a day (BID) | ORAL | 0 refills | Status: DC

## 2020-01-06 MED ORDER — ATORVASTATIN CALCIUM 20 MG PO TABS
20.0000 mg | ORAL_TABLET | Freq: Every day | ORAL | 0 refills | Status: DC
Start: 2020-01-06 — End: 2020-02-29

## 2020-01-06 MED ORDER — LOSARTAN POTASSIUM 100 MG PO TABS
100.0000 mg | ORAL_TABLET | Freq: Every day | ORAL | 0 refills | Status: DC
Start: 2020-01-06 — End: 2020-02-29

## 2020-01-06 NOTE — Addendum Note (Signed)
Addended by: Carylon Perches on: 01/06/2020 04:12 PM   Modules accepted: Orders

## 2020-01-06 NOTE — Telephone Encounter (Signed)
1) Medication(s) Requested (by name):carvedilol (COREG) 3.125 MG tablet    2) Pharmacy of Choice: [facility] [address]   3) Special Requests:   Approved medications will be sent to the pharmacy, we will reach out if there is an issue.  Requests made after 3pm may not be addressed until the following business day!  If a patient is unsure of the name of the medication(s) please note and ask patient to call back when they are able to provide all info, do not send to responsible party until all information is available!

## 2020-01-21 DIAGNOSIS — Z4689 Encounter for fitting and adjustment of other specified devices: Secondary | ICD-10-CM | POA: Diagnosis not present

## 2020-01-21 DIAGNOSIS — N816 Rectocele: Secondary | ICD-10-CM | POA: Diagnosis not present

## 2020-01-21 DIAGNOSIS — N898 Other specified noninflammatory disorders of vagina: Secondary | ICD-10-CM | POA: Diagnosis not present

## 2020-01-21 DIAGNOSIS — N8111 Cystocele, midline: Secondary | ICD-10-CM | POA: Diagnosis not present

## 2020-02-01 DIAGNOSIS — N184 Chronic kidney disease, stage 4 (severe): Secondary | ICD-10-CM | POA: Diagnosis not present

## 2020-02-10 DIAGNOSIS — N184 Chronic kidney disease, stage 4 (severe): Secondary | ICD-10-CM | POA: Diagnosis not present

## 2020-02-10 DIAGNOSIS — D631 Anemia in chronic kidney disease: Secondary | ICD-10-CM | POA: Diagnosis not present

## 2020-02-10 DIAGNOSIS — I129 Hypertensive chronic kidney disease with stage 1 through stage 4 chronic kidney disease, or unspecified chronic kidney disease: Secondary | ICD-10-CM | POA: Diagnosis not present

## 2020-02-10 DIAGNOSIS — E559 Vitamin D deficiency, unspecified: Secondary | ICD-10-CM | POA: Diagnosis not present

## 2020-02-15 DIAGNOSIS — N816 Rectocele: Secondary | ICD-10-CM | POA: Diagnosis not present

## 2020-02-15 DIAGNOSIS — N8111 Cystocele, midline: Secondary | ICD-10-CM | POA: Diagnosis not present

## 2020-02-15 DIAGNOSIS — Z4689 Encounter for fitting and adjustment of other specified devices: Secondary | ICD-10-CM | POA: Diagnosis not present

## 2020-02-26 ENCOUNTER — Ambulatory Visit: Payer: Medicare Other | Admitting: Internal Medicine

## 2020-02-29 ENCOUNTER — Encounter: Payer: Self-pay | Admitting: Family

## 2020-02-29 ENCOUNTER — Other Ambulatory Visit: Payer: Self-pay

## 2020-02-29 ENCOUNTER — Ambulatory Visit (INDEPENDENT_AMBULATORY_CARE_PROVIDER_SITE_OTHER): Payer: Medicare Other | Admitting: Family

## 2020-02-29 VITALS — BP 153/70 | HR 71 | Wt 141.6 lb

## 2020-02-29 DIAGNOSIS — I10 Essential (primary) hypertension: Secondary | ICD-10-CM

## 2020-02-29 DIAGNOSIS — E1122 Type 2 diabetes mellitus with diabetic chronic kidney disease: Secondary | ICD-10-CM

## 2020-02-29 DIAGNOSIS — Z862 Personal history of diseases of the blood and blood-forming organs and certain disorders involving the immune mechanism: Secondary | ICD-10-CM | POA: Diagnosis not present

## 2020-02-29 DIAGNOSIS — Z23 Encounter for immunization: Secondary | ICD-10-CM | POA: Diagnosis not present

## 2020-02-29 DIAGNOSIS — N184 Chronic kidney disease, stage 4 (severe): Secondary | ICD-10-CM

## 2020-02-29 LAB — GLUCOSE, POCT (MANUAL RESULT ENTRY): POC Glucose: 176 mg/dl — AB (ref 70–99)

## 2020-02-29 LAB — POCT GLYCOSYLATED HEMOGLOBIN (HGB A1C): Hemoglobin A1C: 7.5 % — AB (ref 4.0–5.6)

## 2020-02-29 MED ORDER — CARVEDILOL 3.125 MG PO TABS: 3.1250 mg | ORAL_TABLET | Freq: Two times a day (BID) | ORAL | 0 refills | Status: DC

## 2020-02-29 MED ORDER — LOSARTAN POTASSIUM 100 MG PO TABS: 100.0000 mg | ORAL_TABLET | Freq: Every day | ORAL | 0 refills | Status: DC

## 2020-02-29 MED ORDER — ATORVASTATIN CALCIUM 20 MG PO TABS: 20.0000 mg | ORAL_TABLET | Freq: Every day | ORAL | 0 refills | Status: DC

## 2020-02-29 MED ORDER — AMLODIPINE BESYLATE 10 MG PO TABS: 10.0000 mg | ORAL_TABLET | Freq: Every day | ORAL | 0 refills | Status: DC

## 2020-02-29 NOTE — Patient Instructions (Signed)
Continue Amlodipine, Losartan, and Carvedilol for high blood pressure.   Follow-up in 1 month for diabetes check-up and blood pressure check-up at the [facility] on [facility].   Follow-up in 3 months or sooner if needed for blood pressure and diabetes check-up with primary physician.   Keep all appointments with Nephrology.   Lab today.  Diabetes Basics  Diabetes (diabetes mellitus) is a long-term (chronic) disease. It occurs when the body does not properly use sugar (glucose) that is released from food after you eat. Diabetes may be caused by one or both of these problems:  Your pancreas does not make enough of a hormone called insulin.  Your body does not react in a normal way to insulin that it makes. Insulin lets sugars (glucose) go into cells in your body. This gives you energy. If you have diabetes, sugars cannot get into cells. This causes high blood sugar (hyperglycemia). Follow these instructions at home: How is diabetes treated? You may need to take insulin or other diabetes medicines daily to keep your blood sugar in balance. Take your diabetes medicines every day as told by your doctor. List your diabetes medicines here: Diabetes medicines  Name of medicine: ______________________________ ? Amount (dose): _______________ Time (a.m./p.m.): _______________ Notes: ___________________________________  Name of medicine: ______________________________ ? Amount (dose): _______________ Time (a.m./p.m.): _______________ Notes: ___________________________________  Name of medicine: ______________________________ ? Amount (dose): _______________ Time (a.m./p.m.): _______________ Notes: ___________________________________ If you use insulin, you will learn how to give yourself insulin by injection. You may need to adjust the amount based on the food that you eat. List the types of insulin you use here: Insulin  Insulin type:  ______________________________ ? Amount (dose): _______________ Time (a.m./p.m.): _______________ Notes: ___________________________________  Insulin type: ______________________________ ? Amount (dose): _______________ Time (a.m./p.m.): _______________ Notes: ___________________________________  Insulin type: ______________________________ ? Amount (dose): _______________ Time (a.m./p.m.): _______________ Notes: ___________________________________  Insulin type: ______________________________ ? Amount (dose): _______________ Time (a.m./p.m.): _______________ Notes: ___________________________________  Insulin type: ______________________________ ? Amount (dose): _______________ Time (a.m./p.m.): _______________ Notes: ___________________________________ How do I manage my blood sugar?  Check your blood sugar levels using a blood glucose monitor as directed by your doctor. Your doctor will set treatment goals for you. Generally, you should have these blood sugar levels:  Before meals (preprandial): 80-130 mg/dL (4.4-7.2 mmol/L).  After meals (postprandial): below 180 mg/dL (10 mmol/L).  A1c level: less than 7%. Write down the times that you will check your blood sugar levels: Blood sugar checks  Time: _______________ Notes: ___________________________________  Time: _______________ Notes: ___________________________________  Time: _______________ Notes: ___________________________________  Time: _______________ Notes: ___________________________________  Time: _______________ Notes: ___________________________________  Time: _______________ Notes: ___________________________________  What do I need to know about low blood sugar? Low blood sugar is called hypoglycemia. This is when blood sugar is at or below 70 mg/dL (3.9 mmol/L). Symptoms may include:  Feeling: ? Hungry. ? Worried or nervous (anxious). ? Sweaty and clammy. ? Confused. ? Dizzy. ? Sleepy. ? Sick to your  stomach (nauseous).  Having: ? A fast heartbeat. ? A headache. ? A change in your vision. ? Tingling or no feeling (numbness) around the mouth, lips, or tongue. ? Jerky movements that you cannot control (seizure).  Having trouble with: ? Moving (coordination). ? Sleeping. ? Passing out (fainting). ? Getting upset easily (irritability). Treating low blood sugar To treat low blood sugar, eat or drink something sugary right away. If you can think clearly and swallow safely, follow the 15:15 rule:  Take  15 grams of a fast-acting carb (carbohydrate). Talk with your doctor about how much you should take.  Some fast-acting carbs are: ? Sugar tablets (glucose pills). Take 3-4 glucose pills. ? 6-8 pieces of hard candy. ? 4-6 oz (120-150 mL) of fruit juice. ? 4-6 oz (120-150 mL) of regular (not diet) soda. ? 1 Tbsp (15 mL) honey or sugar.  Check your blood sugar 15 minutes after you take the carb.  If your blood sugar is still at or below 70 mg/dL (3.9 mmol/L), take 15 grams of a carb again.  If your blood sugar does not go above 70 mg/dL (3.9 mmol/L) after 3 tries, get help right away.  After your blood sugar goes back to normal, eat a meal or a snack within 1 hour. Treating very low blood sugar If your blood sugar is at or below 54 mg/dL (3 mmol/L), you have very low blood sugar (severe hypoglycemia). This is an emergency. Do not wait to see if the symptoms will go away. Get medical help right away. Call your local emergency services (911 in the [place] Do not drive yourself to the hospital. Questions to ask your health care provider  Do I need to meet with a diabetes educator?  What equipment will I need to care for myself at home?  What diabetes medicines do I need? When should I take them?  How often do I need to check my blood sugar?  What number can I call if I have questions?  When is my next doctor's visit?  Where can I find a support group for people with  diabetes? Where to find more information  American Diabetes Association: www.diabetes.org  American Association of Diabetes Educators: www.diabeteseducator.org/patient-resources Contact a doctor if:  Your blood sugar is at or above 240 mg/dL (13.3 mmol/L) for 2 days in a row.  You have been sick or have had a fever for 2 days or more, and you are not getting better.  You have any of these problems for more than 6 hours: ? You cannot eat or drink. ? You feel sick to your stomach (nauseous). ? You throw up (vomit). ? You have watery poop (diarrhea). Get help right away if:  Your blood sugar is lower than 54 mg/dL (3 mmol/L).  You get confused.  You have trouble: ? Thinking clearly. ? Breathing. Summary  Diabetes (diabetes mellitus) is a long-term (chronic) disease. It occurs when the body does not properly use sugar (glucose) that is released from food after digestion.  Take insulin and diabetes medicines as told.  Check your blood sugar every day, as often as told.  Keep all follow-up visits as told by your doctor. This is important. This information is not intended to replace advice given to you by your health care provider. Make sure you discuss any questions you have with your health care provider. Document Revised: 11/26/2018 Document Reviewed: 06/07/2017 Elsevier Patient Education  [place].

## 2020-02-29 NOTE — Progress Notes (Signed)
Dry skin in inner left ear feels crusty

## 2020-02-29 NOTE — Progress Notes (Signed)
Patient ID: Carolyn Bowen, female    DOB: [birth year]  MRN: [id number]  CC: Hypertension and Diabetes Follow-Up  Subjective: Carolyn Bowen is a 86 y.o. female with history of essential hypertension, controlled type 2 diabetes without complication without long-term current use of insulin, hyperthyroidism, lichen planus, vaginal prolapse, gross hematuria, chronic kidney disease stage 4, vaginal bleeding, and anemia of chronic disease who presents for hypertension and diabetes follow-up.  1. HYPERTENSION FOLLOW-UP: 08/27/2019: Visit with Dr. [name]. Blood pressure controlled at office and at home. Continued on current regimen.  02/29/2020:  Currently taking: see medication list Have you taken your blood pressure medication today: [x]  Yes []  No  Med Adherence: [x]  Yes    []  No Medication side effects: []  Yes    [x]  No Adherence with salt restriction: []  Yes    [x]  No Exercise: Yes []  No [x]  Home Monitoring?: [x]  Yes    []  No Monitoring Frequency: [x]  Yes    []  No Home BP results range: [x]  Yes, 130's/70's Smoking []  Yes [x]  No SOB? []  Yes    [x]  No Chest Pain?: []  Yes    [x]  No Leg swelling?: [x]  Yes, sometimes, taking Furosemide as needed, reports was told to take this no more than every other day per Nephrology Headaches?: []  Yes    [x]  No Dizziness? []  Yes    [x]  No  2. DIABETES TYPE 2 FOLLOW-UP: 08/27/2019: Visit with Dr. [name]. A1C 6.3%. Given age and risk of hypoglycemia as well as controlled glucose, Amaryl discontinued. Follow-up A1C in 6 months. Diabetic foot exam.  Referral to Ophthalmology.   02/29/2020: Last A1C:   Results for orders placed or performed in visit on 02/29/20  POCT glucose (manual entry)  Result Value Ref Range   POC Glucose 176 (A) 70 - 99 mg/dl  POCT glycosylated hemoglobin (Hb A1C)  Result Value Ref Range   Hemoglobin A1C 7.5 (A) 4.0 - 5.6 %   HbA1c POC (<> result, manual entry)     HbA1c, POC (prediabetic range)     HbA1c, POC (controlled  diabetic range)      Are you fasting today:  [x] No, had oatmeal and coffee prior to visit Med Adherence:  Amaryl discontinued at previous visit.  Home Monitoring?  [x]  Yes    []  No Home glucose results range: 160's fasting Diet Adherence: []  Yes    [x]  No, eating many pastas and potatoes  Exercise: []  Yes    [x]  No Hypoglycemic episodes?: []  Yes   [x]  No Numbness of the feet? []  Yes    [x]  No Retinopathy hx? []  Yes    [x]  No Last eye exam: Since last visit did not make it to the eye doctor. History of bilateral eye implants and bilateral cataracts removed. Last visit with eye doctor November 2020 and plans to return January 2022.    Patient Active Problem List   Diagnosis Date Noted  . Positive for microalbuminuria 02/25/2019  . Lichen planus 59/56/3875  . Anemia, chronic disease 12/30/2018  . CKD (chronic kidney disease) stage 4, GFR 15-29 ml/min (HCC) 12/30/2018  . Vaginal prolapse 11/11/2018  . Controlled type 2 diabetes mellitus without complication, without long-term current use of insulin ([place]) 11/11/2018  . Hyperthyroidism 11/11/2018  . Vaginal bleeding 11/11/2018  . Hematuria, gross 11/11/2018  . Essential hypertension 09/06/2014     Current Outpatient Medications on File Prior to Visit  Medication Sig Dispense Refill  . aspirin 81 MG chewable tablet Chew 81 mg by  mouth daily.    . [facility] D3 50 MCG (2000 UT) CAPS Take 1 capsule by mouth daily.    Marland Kitchen esomeprazole (NEXIUM) 20 MG capsule Take 20 mg by mouth daily as needed (heartburn).    . fexofenadine (ALLEGRA) 180 MG tablet Take 180 mg by mouth daily.    . furosemide (LASIX) 40 MG tablet Take 1 tablet by mouth daily as needed.    . methimazole (TAPAZOLE) 10 MG tablet Take 0.5 tablets by mouth 4 (four) times a week.     Marland Kitchen acitretin (SORIATANE) 10 MG capsule 1 CAPSULE WITH A MEAL ONCE A DAY ORALLY 30 (Patient not taking: Reported on 02/29/2020)     No current facility-administered medications on file prior to visit.     Allergies  Allergen Reactions  . Codeine   . Penicillins Hives    Social History   Socioeconomic History  . Marital status: Widowed    Spouse name: Not on file  . Number of children: Not on file  . Years of education: Not on file  . Highest education level: Not on file  Occupational History  . Not on file  Tobacco Use  . Smoking status: Never Smoker  . Smokeless tobacco: Never Used  Vaping Use  . Vaping Use: Never used  Substance and Sexual Activity  . Alcohol use: No    Alcohol/week: 0.0 standard drinks  . Drug use: No  . Sexual activity: Not on file  Other Topics Concern  . Not on file  Social History Narrative  . Not on file   Social Determinants of Health   Financial Resource Strain: Not on file  Food Insecurity: Not on file  Transportation Needs: Not on file  Physical Activity: Not on file  Stress: Not on file  Social Connections: Not on file  Intimate Partner Violence: Not on file    Family History  Problem Relation Age of Onset  . Heart disease Mother   . Cancer Father   . Heart disease Brother   . Breast cancer Daughter     Past Surgical History:  Procedure Laterality Date  . ABDOMINAL HYSTERECTOMY    . APPENDECTOMY    . BREAST BIOPSY Right 2009  . BREAST EXCISIONAL BIOPSY Right 2002  . CATARACT EXTRACTION  2019  . HERNIA REPAIR      ROS: Review of Systems Negative except as stated above  PHYSICAL EXAM: BP (!) 153/70 (BP Location: Left Arm, Patient Position: Sitting)   Pulse 71   Wt 141 lb 9.6 oz (64.2 kg)   SpO2 98%   BMI 25.49 kg/m   Physical Exam Constitutional:      Appearance: Normal appearance.  HENT:     Head: Normocephalic.  Eyes:     Extraocular Movements: Extraocular movements intact.     Conjunctiva/sclera: Conjunctivae normal.     Pupils: Pupils are equal, round, and reactive to light.  Cardiovascular:     Rate and Rhythm: Normal rate and regular rhythm.     Pulses: Normal pulses.     Heart sounds: Normal  heart sounds.  Pulmonary:     Effort: Pulmonary effort is normal.     Breath sounds: Normal breath sounds.  Musculoskeletal:     Cervical back: Normal range of motion and neck supple.     Right lower leg: Edema present.     Left lower leg: Edema present.  Skin:    General: Skin is warm and dry.  Neurological:     General: No  focal deficit present.     Mental Status: She is alert and oriented to person, place, and time.  Psychiatric:        Mood and Affect: Mood normal.        Behavior: Behavior normal.        Thought Content: Thought content normal.        Judgment: Judgment normal.    Diabetic Foot Exam - Simple   Simple Foot Form Visual Inspection No deformities, no ulcerations, no other skin breakdown bilaterally: Yes Sensation Testing Intact to touch and monofilament testing bilaterally: Yes Pulse Check Comments     ASSESSMENT AND PLAN: 1. Essential hypertension: - Blood pressure not at goal during today's visit. Patient asymptomatic without chest pressure, chest pain, palpitations, and shortness of breath. - Patient reports home blood pressure readings are 130's/70's. - Continue Amlodipine, Carvedilol, Losartan, and Atorvastatin as prescribed.  - Follow-up with in 4 weeks with clinical pharmacist for blood pressure check. Write down your blood pressure readings each day and bring those results along with your home blood pressure monitor to your appointment. Medications may be adjusted at that time if needed. - Counseled on blood pressure goal of less than 130/80, low-sodium, DASH diet, medication compliance, and exercise as tolerated. Discussed medication compliance, adverse effects. - BMP to check kidney function and electrolyte balance.  - Follow-up with primary physician in 3 months or sooner if needed.  - Basic Metabolic Panel - amLODipine (NORVASC) 10 MG tablet; Take 1 tablet (10 mg total) by mouth daily.  Dispense: 90 tablet; Refill: 0 - carvedilol (COREG) 3.125  MG tablet; Take 1 tablet (3.125 mg total) by mouth 2 (two) times daily with a meal. Dx: I10  Dispense: 180 tablet; Refill: 0 - losartan (COZAAR) 100 MG tablet; Take 1 tablet (100 mg total) by mouth daily.  Dispense: 90 tablet; Refill: 0  2. Controlled type 2 diabetes mellitus with stage 4 chronic kidney disease, without long-term current use of insulin ([place]): - Patient non-fasting during today's visit. CBG 176. - Hemoglobin A1C not at goal today at 7.5%, goal < 7%. This is increased from previous hemoglobin A1C of 6.3% on 08/27/2019. - To achieve an A1C goal of less than or equal to 7.0 percent, a fasting blood sugar of 80 to 130 mg/dL and a postprandial glucose (90 to 120 minutes after a meal) less than 180 mg/dL. In the event of sugars less than 60 mg/dl or greater than 400 mg/dl please notify the clinic ASAP. It is recommended that you undergo annual eye exams and annual foot exams. - Discussed the importance of healthy eating habits, low-carbohydrate diet, low-sugar diet, regular aerobic exercise (at least 150 minutes a week as tolerated) and medication compliance to achieve or maintain control of diabetes. - BMP to check kidney function and level of electrolyte balance.   - Today reports has not been to Ophthalmology referral since last visit. States she has her own preferred Ophthalmologist and plans to follow-up in January 2022. - Follow-up with clinical pharmacist in 4 weeks for diabetes checkup. Write your home blood sugar results down each day and bring those results to your appointment along with your home glucose monitor. Medications may be revised at that time if needed. - Given patient's age and risk of hypoglycemia will not begin anti-diabetic medications at this time. Follow-up with primary physician in 3 months or sooner if needed.  - POCT glucose (manual entry) - POCT glycosylated hemoglobin (Hb S5K) - Basic Metabolic Panel - atorvastatin (  LIPITOR) 20 MG tablet; Take 1 tablet (20 mg  total) by mouth daily.  Dispense: 90 tablet; Refill: 0  3. CKD (chronic kidney disease) stage 4, GFR 15-29 ml/min [facility]): - Patient reports had appointment with Nephrology November 2021.  - Keep all appointments with Nephrology.  4. History of anemia: - Previously took iron supplements for about 6 months. Repeat hemoglobin in December 2020 was within normal limits.   - Today patient reports she is no longer taking iron supplements which she states was discontinued by Nephrology.  - Last visit with Nephrology in November 2021.  - Keep all appointments with Nephrology.  5. Need for prophylactic vaccination against Streptococcus pneumoniae (pneumococcus): - Patient declines pneumonia vaccine during today's visit.   Patient was given the opportunity to ask questions.  Patient verbalized understanding of the plan and was able to repeat key elements of the plan. Patient was given clear instructions to go to Emergency Department or return to medical center if symptoms don't improve, worsen, or new problems develop.The patient verbalized understanding.   Orders Placed This Encounter  Procedures  . Basic Metabolic Panel  . POCT glucose (manual entry)  . POCT glycosylated hemoglobin (Hb A1C)     Requested Prescriptions   Signed Prescriptions Disp Refills  . amLODipine (NORVASC) 10 MG tablet 90 tablet 0    Sig: Take 1 tablet (10 mg total) by mouth daily.  . carvedilol (COREG) 3.125 MG tablet 180 tablet 0    Sig: Take 1 tablet (3.125 mg total) by mouth 2 (two) times daily with a meal. Dx: I10  . losartan (COZAAR) 100 MG tablet 90 tablet 0    Sig: Take 1 tablet (100 mg total) by mouth daily.  Marland Kitchen atorvastatin (LIPITOR) 20 MG tablet 90 tablet 0    Sig: Take 1 tablet (20 mg total) by mouth daily.    Return in about 3 months (around 05/29/2020) for Dr. [name] and 1 month with Lurena Joiner.  Camillia Herter, NP

## 2020-03-01 LAB — BASIC METABOLIC PANEL
BUN/Creatinine Ratio: 14 (ref 12–28)
BUN: 25 mg/dL (ref 10–36)
CO2: 21 mmol/L (ref 20–29)
Calcium: 10 mg/dL (ref 8.7–10.3)
Chloride: 109 mmol/L — ABNORMAL HIGH (ref 96–106)
Creatinine, Ser: 1.8 mg/dL — ABNORMAL HIGH (ref 0.57–1.00)
GFR calc Af Amer: 28 mL/min/{1.73_m2} — ABNORMAL LOW (ref >59)
GFR calc non Af Amer: 24 mL/min/{1.73_m2} — ABNORMAL LOW (ref >59)
Glucose: 144 mg/dL — ABNORMAL HIGH (ref 65–99)
Potassium: 4.5 mmol/L (ref 3.5–5.2)
Sodium: 143 mmol/L (ref 134–144)

## 2020-03-01 NOTE — Progress Notes (Signed)
Please call patient with update.   Kidney function the same since last year. Keep all appointments with Nephrology.   Diabetes discussed while in clinic.

## 2020-03-25 DIAGNOSIS — N816 Rectocele: Secondary | ICD-10-CM | POA: Diagnosis not present

## 2020-03-25 DIAGNOSIS — Z4689 Encounter for fitting and adjustment of other specified devices: Secondary | ICD-10-CM | POA: Diagnosis not present

## 2020-03-25 DIAGNOSIS — N8111 Cystocele, midline: Secondary | ICD-10-CM | POA: Diagnosis not present

## 2020-03-29 NOTE — Progress Notes (Signed)
S  PCP: Dr. [name] PMH: HTN, T2DM, CKD stage 4  Patient arrives in good spirits ambulating with a cane. Presents to the clinic for diabetes and hypertension evaluation, counseling, and management. Patient was referred and last seen by Primary Care Provider on 02/29/20 at which time BP was elevated at 153/70 in clinic, however, home Bps average 130s/70s. No changes were made to HTN regimen. Additionally, pt's A1c increased from 6.3% to 7.5%. She is not currently on any DM medications. She was previously on glimepiride 1 mg daily, however, this was discontinued in June 2021 due to controlled A1C, age, and risk of hypoglycemia.  Today, patient reports medication adherence with HTN medications and is currently not taking any medications for diabetes management. Denies polydipsia, polyuria, and neuropathy. Reports fasting AM sugars average 163 (see below). Denies sugars <70 and always has glucose tablets with her. Reports home BP readings average 130/68 (see below). Denies dizziness, headaches, blurred vision, and chest pain/palpitations. Reports mild LE swelling, but Lasix PRN has been helping. Reports drinking 1 cup of coffee this morning.  Family/Social History:  -Fhx: heart disease in mother and brother; cancer in father; breast cancer in daughter -Tobacco use: denies  Insurance coverage/medication affordability: Medicare A/B  Medication adherence reported.   Current diabetes medications include: none Current hypertension medications include: amlodipine 10 mg daily (AM), losartan 100 mg daily (PM), carvedilol 3.125 mg BID, Lasix 40 mg PRN Current hyperlipidemia medications include: atorvastatin 20 mg daily  Patient denies hypoglycemic events.  Patient reported dietary habits: trying to do better with eating habits   Patient denies nocturia (nighttime urination).  Patient denies neuropathy (nerve pain). Patient denies visual changes. Patient reports self foot exams.    O:  Uses a  wrist cuff to measure BP   SBP DBP  Fasting BG   134 74  168   123 62  178   133 70  149   131 72  160   137 67  154   134 62  163   135 74  150   134 66  168   117  65  176   137 74  172   127 67  161   134 74  168   128 73  173   136 70  156   128 75  171   123 64  156   129 70   168   125 62  181   123 63  169      125      151  Average 130 68  163    Lab Results  Component Value Date   HGBA1C 7.5 (A) 02/29/2020   Vitals:   03/30/20 1043  BP: (!) 146/76  Pulse: 66    Lipid Panel     Component Value Date/Time   CHOL 133 11/11/2018 1245   TRIG 134 11/11/2018 1245   HDL 55 11/11/2018 1245   CHOLHDL 2.4 11/11/2018 1245   LDLCALC 51 11/11/2018 1245    Clinical Atherosclerotic Cardiovascular Disease (ASCVD): No  The ASCVD Risk score Mikey Bussing DC Jr., et al., 2013) failed to calculate for the following reasons:   The 2013 ASCVD risk score is only valid for ages 31 to 40    A/P: Diabetes longstanding currently at goal of <8% given age and risk of hypoglycemia. Patient is able to verbalize appropriate hypoglycemia management plan.  -Given patient's age and risk of hypoglycemia will not begin anti-diabetic medications at this time. -Extensively  discussed pathophysiology of diabetes, recommended lifestyle interventions, dietary effects on blood sugar control -Counseled on s/sx of and management of hypoglycemia -Next A1C anticipated March 2022.   ASCVD risk - primary prevention in patient with diabetes. Last LDL is controlled.  -Continued atorvastatin 20 mg daily  Hypertension longstanding currently controlled with home readings averaging 130/68.  Given age, blood pressure goal = 140/90 mmHg. Medication adherence appears optimal.  -Continued amlodipine 10 mg daily -Continued losartan 100 mg daily -Continued carvedilol 3.125 mg BID  Written patient instructions provided.  Total time in face to face counseling 25 minutes.   Follow up PCP Clinic Visit in March 2022.     Lorel Monaco, PharmD, [place] PGY2 Ambulatory Care Resident [place]

## 2020-03-30 ENCOUNTER — Ambulatory Visit: Payer: Medicare Other | Attending: Family Medicine | Admitting: Pharmacist

## 2020-03-30 ENCOUNTER — Other Ambulatory Visit: Payer: Self-pay

## 2020-03-30 ENCOUNTER — Encounter: Payer: Self-pay | Admitting: Pharmacist

## 2020-03-30 VITALS — BP 146/76 | HR 66

## 2020-03-30 DIAGNOSIS — E119 Type 2 diabetes mellitus without complications: Secondary | ICD-10-CM

## 2020-03-30 DIAGNOSIS — I10 Essential (primary) hypertension: Secondary | ICD-10-CM | POA: Diagnosis not present

## 2020-04-13 DIAGNOSIS — E119 Type 2 diabetes mellitus without complications: Secondary | ICD-10-CM | POA: Diagnosis not present

## 2020-04-13 DIAGNOSIS — I10 Essential (primary) hypertension: Secondary | ICD-10-CM | POA: Diagnosis not present

## 2020-04-13 DIAGNOSIS — E785 Hyperlipidemia, unspecified: Secondary | ICD-10-CM | POA: Diagnosis not present

## 2020-04-13 DIAGNOSIS — I739 Peripheral vascular disease, unspecified: Secondary | ICD-10-CM | POA: Diagnosis not present

## 2020-04-13 DIAGNOSIS — E0590 Thyrotoxicosis, unspecified without thyrotoxic crisis or storm: Secondary | ICD-10-CM | POA: Diagnosis not present

## 2020-05-17 ENCOUNTER — Other Ambulatory Visit: Payer: Self-pay | Admitting: Family

## 2020-05-17 DIAGNOSIS — I10 Essential (primary) hypertension: Secondary | ICD-10-CM

## 2020-05-30 ENCOUNTER — Encounter: Payer: Self-pay | Admitting: Internal Medicine

## 2020-05-30 ENCOUNTER — Ambulatory Visit (INDEPENDENT_AMBULATORY_CARE_PROVIDER_SITE_OTHER): Payer: Medicare Other | Admitting: Internal Medicine

## 2020-05-30 ENCOUNTER — Other Ambulatory Visit: Payer: Self-pay

## 2020-05-30 VITALS — BP 147/78 | HR 66 | Temp 97.2°F | Resp 12 | Wt 141.0 lb

## 2020-05-30 DIAGNOSIS — N184 Chronic kidney disease, stage 4 (severe): Secondary | ICD-10-CM | POA: Diagnosis not present

## 2020-05-30 DIAGNOSIS — I10 Essential (primary) hypertension: Secondary | ICD-10-CM | POA: Diagnosis not present

## 2020-05-30 DIAGNOSIS — E1122 Type 2 diabetes mellitus with diabetic chronic kidney disease: Secondary | ICD-10-CM

## 2020-05-30 LAB — POCT GLYCOSYLATED HEMOGLOBIN (HGB A1C): HbA1c, POC (controlled diabetic range): 7.6 % — AB (ref 0.0–7.0)

## 2020-05-30 LAB — GLUCOSE, POCT (MANUAL RESULT ENTRY): POC Glucose: 217 mg/dl — AB (ref 70–99)

## 2020-05-30 NOTE — Progress Notes (Signed)
Subjective:    Carolyn Bowen - 87 y.o. female MRN [id number]  Date of birth: 10-Sep-1927  HPI  Gurveen Tiggs is here for follow up of chronic medical condiitons.   Diabetes mellitus, Type 2 Disease Monitoring             Blood Sugar Ranges: Fasting -150-180s             Polyuria: no             Visual problems: no   Urine Microalbumin 140 (Dec 2020)--on Arb therapy   Last A1C: 7.5 (Dec 2021)   Medications: None---was previously on Amaryl this was discontinued due to hypoglycemia with well controlled A1c for age  Medication Compliance: N/A  Medication Side Effects             Hypoglycemia: no   Chronic HTN Disease Monitoring:  Home BP Monitoring - 120-140s/60-70s highest in past month was 142/79 Chest pain- no  Dyspnea- no Headache - no  Medications: Amlodipine 10 mg, Coreg 3.125 mg BID, Losartan 100 mg  Compliance- yes Lightheadedness- no  Edema- no    Health Maintenance:  Health Maintenance Due  Topic Date Due  . OPHTHALMOLOGY EXAM  Never done    -  reports that she has never smoked. She has never used smokeless tobacco. - Review of Systems: Per HPI. - Past Medical History: Patient Active Problem List   Diagnosis Date Noted  . Positive for microalbuminuria 02/25/2019  . Lichen planus [id number]  . Anemia, chronic disease 12/30/2018  . CKD (chronic kidney disease) stage 4, GFR 15-29 ml/min (HCC) 12/30/2018  . Vaginal prolapse 11/11/2018  . Controlled type 2 diabetes mellitus without complication, without long-term current use of insulin ([place]) 11/11/2018  . Hyperthyroidism 11/11/2018  . Vaginal bleeding 11/11/2018  . Hematuria, gross 11/11/2018  . Essential hypertension 09/06/2014   - Medications: reviewed and updated   Objective:   Physical Exam BP (!) 147/78   Pulse 66   Temp (!) 97.2 F (36.2 C)   Resp 12   Wt 141 lb (64 kg)   SpO2 97%   BMI 25.38 kg/m  Physical Exam Constitutional:      General: She is not in acute  distress.    Appearance: She is not diaphoretic.  HENT:     Head: Normocephalic and atraumatic.  Eyes:     Conjunctiva/sclera: Conjunctivae normal.  Cardiovascular:     Rate and Rhythm: Normal rate and regular rhythm.     Heart sounds: Normal heart sounds. No murmur heard.   Pulmonary:     Effort: Pulmonary effort is normal. No respiratory distress.     Breath sounds: Normal breath sounds.  Musculoskeletal:        General: Normal range of motion.  Skin:    General: Skin is warm and dry.  Neurological:     Mental Status: She is alert and oriented to person, place, and time.  Psychiatric:        Mood and Affect: Affect normal.        Judgment: Judgment normal.            Assessment & Plan:    1. Controlled type 2 diabetes mellitus with stage 4 chronic kidney disease, without long-term current use of insulin (HCC) A1c 7.6, at goal for age. Continue diet management. Will not start any diabetic agents given good glycemic control for age and recent history of hypoglycemia on medications. Monitor q6 months. Given parameters for home glucose  monitoring that would warrant quicker f/u.  - Glucose (CBG) - HgB A1c  2. Essential hypertension BP slightly above goal but given age and well controlled at home, continue current regimen. Given parameters for monitoring at home that would warrant alerting the clinic.    Phill Myron, D.O. 05/30/2020, 10:11 AM Primary Care at [facility]

## 2020-05-30 NOTE — Progress Notes (Signed)
F/u DM and HTN  Concerns about blood sugars CBG- 217 (non fasting) A1C- 7.6

## 2020-05-30 NOTE — Patient Instructions (Addendum)
Alert [place] if you see blood pressures that are higher than 150 on the top or 90 on bottom. Monitor your blood sugar and let us know if you see numbers higher than 200.    Take care of yourself! Good to see you!  Dr. [name]

## 2020-06-06 ENCOUNTER — Other Ambulatory Visit: Payer: Self-pay | Admitting: Family

## 2020-06-06 DIAGNOSIS — I10 Essential (primary) hypertension: Secondary | ICD-10-CM

## 2020-06-20 ENCOUNTER — Ambulatory Visit: Payer: Medicare Other | Admitting: Nurse Practitioner

## 2020-06-22 DIAGNOSIS — N816 Rectocele: Secondary | ICD-10-CM | POA: Diagnosis not present

## 2020-06-22 DIAGNOSIS — N8110 Cystocele, unspecified: Secondary | ICD-10-CM | POA: Diagnosis not present

## 2020-06-22 DIAGNOSIS — Z4689 Encounter for fitting and adjustment of other specified devices: Secondary | ICD-10-CM | POA: Diagnosis not present

## 2020-07-06 DIAGNOSIS — N189 Chronic kidney disease, unspecified: Secondary | ICD-10-CM | POA: Diagnosis not present

## 2020-07-06 DIAGNOSIS — E119 Type 2 diabetes mellitus without complications: Secondary | ICD-10-CM | POA: Diagnosis not present

## 2020-07-06 DIAGNOSIS — I10 Essential (primary) hypertension: Secondary | ICD-10-CM | POA: Diagnosis not present

## 2020-07-06 DIAGNOSIS — E0590 Thyrotoxicosis, unspecified without thyrotoxic crisis or storm: Secondary | ICD-10-CM | POA: Diagnosis not present

## 2020-07-06 DIAGNOSIS — I739 Peripheral vascular disease, unspecified: Secondary | ICD-10-CM | POA: Diagnosis not present

## 2020-07-06 DIAGNOSIS — E785 Hyperlipidemia, unspecified: Secondary | ICD-10-CM | POA: Diagnosis not present

## 2020-07-18 DIAGNOSIS — N184 Chronic kidney disease, stage 4 (severe): Secondary | ICD-10-CM | POA: Diagnosis not present

## 2020-07-25 DIAGNOSIS — E559 Vitamin D deficiency, unspecified: Secondary | ICD-10-CM | POA: Diagnosis not present

## 2020-07-25 DIAGNOSIS — R609 Edema, unspecified: Secondary | ICD-10-CM | POA: Diagnosis not present

## 2020-07-25 DIAGNOSIS — N184 Chronic kidney disease, stage 4 (severe): Secondary | ICD-10-CM | POA: Diagnosis not present

## 2020-07-25 DIAGNOSIS — I129 Hypertensive chronic kidney disease with stage 1 through stage 4 chronic kidney disease, or unspecified chronic kidney disease: Secondary | ICD-10-CM | POA: Diagnosis not present

## 2020-07-25 DIAGNOSIS — D631 Anemia in chronic kidney disease: Secondary | ICD-10-CM | POA: Diagnosis not present

## 2020-08-22 DIAGNOSIS — E1151 Type 2 diabetes mellitus with diabetic peripheral angiopathy without gangrene: Secondary | ICD-10-CM | POA: Diagnosis not present

## 2020-08-22 DIAGNOSIS — M6281 Muscle weakness (generalized): Secondary | ICD-10-CM | POA: Diagnosis not present

## 2020-08-22 DIAGNOSIS — L602 Onychogryphosis: Secondary | ICD-10-CM | POA: Diagnosis not present

## 2020-08-22 DIAGNOSIS — L84 Corns and callosities: Secondary | ICD-10-CM | POA: Diagnosis not present

## 2020-08-22 DIAGNOSIS — I739 Peripheral vascular disease, unspecified: Secondary | ICD-10-CM | POA: Diagnosis not present

## 2020-08-22 DIAGNOSIS — M79671 Pain in right foot: Secondary | ICD-10-CM | POA: Diagnosis not present

## 2020-08-26 ENCOUNTER — Other Ambulatory Visit: Payer: Self-pay | Admitting: Family

## 2020-08-26 DIAGNOSIS — E1122 Type 2 diabetes mellitus with diabetic chronic kidney disease: Secondary | ICD-10-CM

## 2020-08-26 DIAGNOSIS — I10 Essential (primary) hypertension: Secondary | ICD-10-CM

## 2020-09-07 ENCOUNTER — Other Ambulatory Visit: Payer: Self-pay | Admitting: Family

## 2020-09-07 DIAGNOSIS — I10 Essential (primary) hypertension: Secondary | ICD-10-CM

## 2020-09-08 NOTE — Telephone Encounter (Signed)
Carvedilol refilled for courtesy 30 day supply. Please schedule appointment for additional refills.

## 2020-09-20 ENCOUNTER — Other Ambulatory Visit: Payer: Self-pay | Admitting: Internal Medicine

## 2020-09-20 DIAGNOSIS — Z1231 Encounter for screening mammogram for malignant neoplasm of breast: Secondary | ICD-10-CM

## 2020-09-26 DIAGNOSIS — E0590 Thyrotoxicosis, unspecified without thyrotoxic crisis or storm: Secondary | ICD-10-CM | POA: Diagnosis not present

## 2020-09-26 DIAGNOSIS — N182 Chronic kidney disease, stage 2 (mild): Secondary | ICD-10-CM | POA: Diagnosis not present

## 2020-09-26 DIAGNOSIS — E1165 Type 2 diabetes mellitus with hyperglycemia: Secondary | ICD-10-CM | POA: Diagnosis not present

## 2020-09-26 DIAGNOSIS — E7800 Pure hypercholesterolemia, unspecified: Secondary | ICD-10-CM | POA: Diagnosis not present

## 2020-09-26 DIAGNOSIS — I10 Essential (primary) hypertension: Secondary | ICD-10-CM | POA: Diagnosis not present

## 2020-09-28 DIAGNOSIS — N816 Rectocele: Secondary | ICD-10-CM | POA: Diagnosis not present

## 2020-09-28 DIAGNOSIS — Z4689 Encounter for fitting and adjustment of other specified devices: Secondary | ICD-10-CM | POA: Diagnosis not present

## 2020-09-28 DIAGNOSIS — N8111 Cystocele, midline: Secondary | ICD-10-CM | POA: Diagnosis not present

## 2020-10-05 ENCOUNTER — Other Ambulatory Visit: Payer: Self-pay

## 2020-10-05 DIAGNOSIS — I10 Essential (primary) hypertension: Secondary | ICD-10-CM

## 2020-10-05 MED ORDER — CARVEDILOL 3.125 MG PO TABS: 3.1250 mg | ORAL_TABLET | Freq: Two times a day (BID) | ORAL | 0 refills | Status: DC

## 2020-10-05 NOTE — Progress Notes (Signed)
Carvedilol refilled.

## 2020-10-12 DIAGNOSIS — I10 Essential (primary) hypertension: Secondary | ICD-10-CM | POA: Diagnosis not present

## 2020-10-12 DIAGNOSIS — E1169 Type 2 diabetes mellitus with other specified complication: Secondary | ICD-10-CM | POA: Diagnosis not present

## 2020-10-12 DIAGNOSIS — I739 Peripheral vascular disease, unspecified: Secondary | ICD-10-CM | POA: Diagnosis not present

## 2020-10-31 DIAGNOSIS — E1351 Other specified diabetes mellitus with diabetic peripheral angiopathy without gangrene: Secondary | ICD-10-CM | POA: Diagnosis not present

## 2020-10-31 DIAGNOSIS — L602 Onychogryphosis: Secondary | ICD-10-CM | POA: Diagnosis not present

## 2020-11-08 NOTE — Progress Notes (Signed)
Patient ID: Carolyn Bowen, female    DOB: [birth year]  MRN: [id number]  CC: Hypertension and Diabetes Follow-Up  Subjective: Carolyn Bowen is a 87 y.o. female who presents for hypertension and diabetes follow-up.   Her concerns today include:   HYPERTENSION FOLLOW-UP: 05/30/2020 per DO note: BP slightly above goal but given age and well controlled at home, continue current regimen. Given parameters for monitoring at home that would warrant alerting the clinic.   11/09/2020: Doing well on current regimen. No side effects. No issues/concerns. Home blood pressures 120's-130's/60's-70's.  2. DIABETES TYPE 2 FOLLOW-UP: 05/30/2020 per DO note: A1c 7.6, at goal for age. Continue diet management. Will not start any diabetic agents given good glycemic control for age and recent history of hypoglycemia on medications. Monitor q6 months. Given parameters for home glucose monitoring that would warrant quicker f/u.   11/09/2020: No issues/concerns. Home blood sugars 160's-170's.   Patient Active Problem List   Diagnosis Date Noted   Hypercholesterolemia 11/09/2020   Hyperglycemia due to type 2 diabetes mellitus ([place]) 11/09/2020   Positive for microalbuminuria [id number]   Lichen planus [id number]   Anemia, chronic disease 12/30/2018   Chronic kidney disease, stage 2 (mild) 12/30/2018   Vaginal prolapse 11/11/2018   Controlled type 2 diabetes mellitus without complication, without long-term current use of insulin ([place]) 11/11/2018   Thyrotoxicosis 11/11/2018   Vaginal bleeding 11/11/2018   Hematuria, gross 11/11/2018   Essential hypertension 09/06/2014     Current Outpatient Medications on File Prior to Visit  Medication Sig Dispense Refill   aspirin 81 MG chewable tablet Chew 81 mg by mouth daily.     atorvastatin (LIPITOR) 20 MG tablet TAKE 1 TABLET BY MOUTH EVERY DAY 90 tablet 0   Cholecalciferol (VITAMIN D3) 50 MCG (2000 UT) capsule 1 capsule     [facility] D3 50 MCG (2000 UT) CAPS Take 1  capsule by mouth daily.     esomeprazole (NEXIUM) 20 MG capsule Take 20 mg by mouth daily as needed (heartburn).     estradiol (ESTRACE) 0.1 MG/GM vaginal cream Place 1 g vaginally 2 (two) times a week.     fexofenadine (ALLEGRA) 180 MG tablet Take 180 mg by mouth daily.     furosemide (LASIX) 40 MG tablet Take 1 tablet by mouth daily as needed.     methimazole (TAPAZOLE) 10 MG tablet Take 0.5 tablets by mouth 4 (four) times a week.      No current facility-administered medications on file prior to visit.    Allergies  Allergen Reactions   Codeine Other (See Comments)   Penicillin G Benzathine Other (See Comments)   Penicillins Hives   Amlodipine Besy-Benazepril Hcl Rash    Social History   Socioeconomic History   Marital status: Widowed    Spouse name: Not on file   Number of children: Not on file   Years of education: Not on file   Highest education level: Not on file  Occupational History   Not on file  Tobacco Use   Smoking status: Never   Smokeless tobacco: Never  Vaping Use   Vaping Use: Never used  Substance and Sexual Activity   Alcohol use: No    Alcohol/week: 0.0 standard drinks   Drug use: No   Sexual activity: Not on file  Other Topics Concern   Not on file  Social History Narrative   Not on file   Social Determinants of Health   Financial Resource Strain: Not on file  Food Insecurity: Not on file  Transportation Needs: Not on file  Physical Activity: Not on file  Stress: Not on file  Social Connections: Not on file  Intimate Partner Violence: Not on file    Family History  Problem Relation Age of Onset   Heart disease Mother    Cancer Father    Heart disease Brother    Breast cancer Daughter     Past Surgical History:  Procedure Laterality Date   ABDOMINAL HYSTERECTOMY     APPENDECTOMY     BREAST BIOPSY Right 2009   BREAST EXCISIONAL BIOPSY Right 2002   CATARACT EXTRACTION  2019   HERNIA REPAIR      ROS: Review of Systems Negative  except as stated above  PHYSICAL EXAM: BP (!) 145/64 (BP Location: Left Arm, Patient Position: Sitting, Cuff Size: Normal)   Pulse 65   Temp 98.4 F (36.9 C)   Resp 15   Ht 5' 2.8" (1.595 m)   Wt 141 lb 3.2 oz (64 kg)   SpO2 98%   BMI 25.18 kg/m   Physical Exam HENT:     Head: Normocephalic and atraumatic.  Eyes:     Extraocular Movements: Extraocular movements intact.     Conjunctiva/sclera: Conjunctivae normal.     Pupils: Pupils are equal, round, and reactive to light.  Cardiovascular:     Rate and Rhythm: Normal rate and regular rhythm.     Pulses: Normal pulses.     Heart sounds: Normal heart sounds.  Pulmonary:     Effort: Pulmonary effort is normal.     Breath sounds: Normal breath sounds.  Musculoskeletal:     Cervical back: Normal range of motion and neck supple.  Neurological:     General: No focal deficit present.     Mental Status: She is alert and oriented to person, place, and time.  Psychiatric:        Mood and Affect: Mood normal.        Behavior: Behavior normal.   Results for orders placed or performed in visit on 11/09/20  POCT glycosylated hemoglobin (Hb A1C)  Result Value Ref Range   Hemoglobin A1C 7.1 (A) 4.0 - 5.6 %   HbA1c POC (<> result, manual entry)     HbA1c, POC (prediabetic range)     HbA1c, POC (controlled diabetic range)      ASSESSMENT AND PLAN: 1. Essential hypertension: - Home blood pressures well controlled.  - Continue Amlodipine, Carvedilol, and Losartan as prescribed.  - Counseled on blood pressure goal of less than 140/90, low-sodium, DASH diet, and medication compliance. Discussed medication compliance, adverse effects. - Follow-up with primary provider in 6 months or sooner if needed. - amLODipine (NORVASC) 10 MG tablet; Take 1 tablet (10 mg total) by mouth daily.  Dispense: 90 tablet; Refill: 0 - carvedilol (COREG) 3.125 MG tablet; Take 1 tablet (3.125 mg total) by mouth 2 (two) times daily with a meal.  Dispense: 60  tablet; Refill: 2 - losartan (COZAAR) 100 MG tablet; Take 1 tablet (100 mg total) by mouth daily.  Dispense: 90 tablet; Refill: 0  2. Controlled type 2 diabetes mellitus with stage 4 chronic kidney disease, without long-term current use of insulin ([place]): - Hemoglobin A1c today at goal at 7.1%, goal < 8%. This is improved from previous hemoglobin A1c of 7.6% on 05/30/2020.  - Continue diet management. . Will not start any diabetic agents given good glycemic control for age and recent history of hypoglycemia on medications. Monitor  every 6 months. Given parameters for home glucose monitoring that would warrant quicker follow-up.  - POCT glycosylated hemoglobin (Hb A1C)    Patient was given the opportunity to ask questions.  Patient verbalized understanding of the plan and was able to repeat key elements of the plan. Patient was given clear instructions to go to Emergency Department or return to medical center if symptoms don't improve, worsen, or new problems develop.The patient verbalized understanding.   Orders Placed This Encounter  Procedures   POCT glycosylated hemoglobin (Hb A1C)     Requested Prescriptions   Signed Prescriptions Disp Refills   amLODipine (NORVASC) 10 MG tablet 90 tablet 0    Sig: Take 1 tablet (10 mg total) by mouth daily.   carvedilol (COREG) 3.125 MG tablet 60 tablet 2    Sig: Take 1 tablet (3.125 mg total) by mouth 2 (two) times daily with a meal.   losartan (COZAAR) 100 MG tablet 90 tablet 0    Sig: Take 1 tablet (100 mg total) by mouth daily.    Return in about 6 months (around 05/12/2021) for Follow-Up or next available hypertension and diabetes.  Camillia Herter, NP

## 2020-11-09 ENCOUNTER — Encounter: Payer: Self-pay | Admitting: Family

## 2020-11-09 ENCOUNTER — Ambulatory Visit (INDEPENDENT_AMBULATORY_CARE_PROVIDER_SITE_OTHER): Payer: Medicare Other | Admitting: Family

## 2020-11-09 ENCOUNTER — Other Ambulatory Visit: Payer: Self-pay

## 2020-11-09 VITALS — BP 145/64 | HR 65 | Temp 98.4°F | Resp 15 | Ht 62.8 in | Wt 141.2 lb

## 2020-11-09 DIAGNOSIS — E1122 Type 2 diabetes mellitus with diabetic chronic kidney disease: Secondary | ICD-10-CM

## 2020-11-09 DIAGNOSIS — N184 Chronic kidney disease, stage 4 (severe): Secondary | ICD-10-CM

## 2020-11-09 DIAGNOSIS — E7800 Pure hypercholesterolemia, unspecified: Secondary | ICD-10-CM | POA: Insufficient documentation

## 2020-11-09 DIAGNOSIS — E1165 Type 2 diabetes mellitus with hyperglycemia: Secondary | ICD-10-CM | POA: Insufficient documentation

## 2020-11-09 DIAGNOSIS — I10 Essential (primary) hypertension: Secondary | ICD-10-CM

## 2020-11-09 LAB — POCT GLYCOSYLATED HEMOGLOBIN (HGB A1C): Hemoglobin A1C: 7.1 % — AB (ref 4.0–5.6)

## 2020-11-09 MED ORDER — LOSARTAN POTASSIUM 100 MG PO TABS: 100.0000 mg | ORAL_TABLET | Freq: Every day | ORAL | 0 refills | Status: DC

## 2020-11-09 MED ORDER — CARVEDILOL 3.125 MG PO TABS: 3.1250 mg | ORAL_TABLET | Freq: Two times a day (BID) | ORAL | 2 refills | Status: DC

## 2020-11-09 MED ORDER — AMLODIPINE BESYLATE 10 MG PO TABS: 10.0000 mg | ORAL_TABLET | Freq: Every day | ORAL | 0 refills | Status: DC

## 2020-11-09 NOTE — Progress Notes (Signed)
Pt presents for hypertension and diabetes follow-up

## 2020-11-14 ENCOUNTER — Ambulatory Visit: Payer: Medicare Other

## 2020-11-30 ENCOUNTER — Ambulatory Visit: Payer: Medicare Other | Admitting: Internal Medicine

## 2020-12-09 ENCOUNTER — Telehealth: Payer: Self-pay | Admitting: Family

## 2020-12-09 ENCOUNTER — Other Ambulatory Visit: Payer: Self-pay | Admitting: Family

## 2020-12-09 DIAGNOSIS — E1122 Type 2 diabetes mellitus with diabetic chronic kidney disease: Secondary | ICD-10-CM

## 2020-12-09 NOTE — Telephone Encounter (Signed)
Patient called stating she needs a refill on her medication. Patient stated she has called her pharmacy as well.  atorvastatin (LIPITOR) 20 MG tablet   Ph[facility]acy  [id number] [place] [place] Phone:  [phone] Fax:  [phone] DEA #:  [id number]

## 2020-12-10 ENCOUNTER — Other Ambulatory Visit: Payer: Self-pay

## 2020-12-10 ENCOUNTER — Ambulatory Visit (INDEPENDENT_AMBULATORY_CARE_PROVIDER_SITE_OTHER): Payer: Medicare Other

## 2020-12-10 DIAGNOSIS — Z0000 Encounter for general adult medical examination without abnormal findings: Secondary | ICD-10-CM | POA: Diagnosis not present

## 2020-12-10 DIAGNOSIS — E1122 Type 2 diabetes mellitus with diabetic chronic kidney disease: Secondary | ICD-10-CM

## 2020-12-10 MED ORDER — ATORVASTATIN CALCIUM 20 MG PO TABS: 20.0000 mg | ORAL_TABLET | Freq: Every day | ORAL | 0 refills | Status: DC

## 2020-12-10 NOTE — Progress Notes (Signed)
Subjective:   Carolyn Bowen is a 87 y.o. female who presents for Medicare Annual (Subsequent) preventive examination.  I connected with  Carolyn Bowen on 12/10/20 by a audio enabled telemedicine application and verified that I am speaking with the correct person using two identifiers.   I discussed the limitations of evaluation and management by telemedicine. The patient expressed understanding and agreed to proceed.  Location of patient: Home Location of provider: Office  Review of Systems    Defer to PCP       Objective:    There were no vitals filed for this visit. There is no height or weight on file to calculate BMI.  Advanced Directives 08/08/2015  Does Patient Have a Medical Advance Directive? Yes    Current Medications (verified) Outpatient Encounter Medications as of 12/10/2020  Medication Sig   amLODipine (NORVASC) 10 MG tablet Take 1 tablet (10 mg total) by mouth daily.   aspirin 81 MG chewable tablet Chew 81 mg by mouth daily.   atorvastatin (LIPITOR) 20 MG tablet Take 1 tablet (20 mg total) by mouth daily.   carvedilol (COREG) 3.125 MG tablet Take 1 tablet (3.125 mg total) by mouth 2 (two) times daily with a meal.   Cholecalciferol (VITAMIN D3) 50 MCG (2000 UT) capsule 1 capsule   CVS D3 50 MCG (2000 UT) CAPS Take 1 capsule by mouth daily.   esomeprazole (NEXIUM) 20 MG capsule Take 20 mg by mouth daily as needed (heartburn).   estradiol (ESTRACE) 0.1 MG/GM vaginal cream Place 1 g vaginally 2 (two) times a week.   fexofenadine (ALLEGRA) 180 MG tablet Take 180 mg by mouth daily.   furosemide (LASIX) 40 MG tablet Take 1 tablet by mouth daily as needed.   losartan (COZAAR) 100 MG tablet Take 1 tablet (100 mg total) by mouth daily.   methimazole (TAPAZOLE) 10 MG tablet Take 0.5 tablets by mouth 4 (four) times a week.    [DISCONTINUED] atorvastatin (LIPITOR) 20 MG tablet TAKE 1 TABLET BY MOUTH EVERY DAY   No facility-administered encounter medications on file  as of 12/10/2020.    Allergies (verified) Codeine, Penicillin g benzathine, Penicillins, and Amlodipine besy-benazepril hcl   History: Past Medical History:  Diagnosis Date   Abdominal pain    Diabetes mellitus without complication ([place])    Hypertension    Thyroid disease    Past Surgical History:  Procedure Laterality Date   ABDOMINAL HYSTERECTOMY     APPENDECTOMY     BREAST BIOPSY Right 2009   BREAST EXCISIONAL BIOPSY Right 2002   CATARACT EXTRACTION  2019   HERNIA REPAIR     Family History  Problem Relation Age of Onset   Heart disease Mother    Cancer Father    Heart disease Brother    Breast cancer Daughter    Social History   Socioeconomic History   Marital status: Widowed    Spouse name: Not on file   Number of children: Not on file   Years of education: Not on file   Highest education level: Not on file  Occupational History   Not on file  Tobacco Use   Smoking status: Never   Smokeless tobacco: Never  Vaping Use   Vaping Use: Never used  Substance and Sexual Activity   Alcohol use: No    Alcohol/week: 0.0 standard drinks   Drug use: No   Sexual activity: Not on file  Other Topics Concern   Not on file  Social History  Narrative   Not on file   Social Determinants of Health   Financial Resource Strain: Not on file  Food Insecurity: Not on file  Transportation Needs: Not on file  Physical Activity: Not on file  Stress: Not on file  Social Connections: Not on file    Tobacco Counseling Counseling given: Not Answered   Clinical Intake:                 Diabetic?Yes         Activities of Daily Living In your present state of health, do you have any difficulty performing the following activities: 02/29/2020  Hearing? N  Vision? N  Difficulty concentrating or making decisions? N  Walking or climbing stairs? N  Comment uses cane  Dressing or bathing? N  Doing errands, shopping? N  Some recent data might be hidden     Patient Care Team: Camillia Herter, NP as PCP - General (Nurse Practitioner)  Indicate any recent Medical Services you may have received from other than [facility] providers in the past year (date may be approximate).     Assessment:   This is a routine wellness examination for Carolyn Bowen.  Hearing/Vision screen No results found.  Dietary issues and exercise activities discussed:     Goals Addressed   None   Depression Screen PHQ 2/9 Scores 11/09/2020 05/30/2020 02/29/2020 11/11/2018 08/08/2015 09/06/2014  PHQ - 2 Score 0 0 0 0 0 0  PHQ- 9 Score - 0 - - - -    Fall Risk Fall Risk  05/30/2020 02/29/2020 11/11/2018 08/08/2015 09/06/2014  Falls in the past year? 0 0 0 No No  Number falls in past yr: 0 0 0 - -  Injury with Fall? 0 0 0 - -  Risk for fall due to : No Fall Risks No Fall Risks - - -  Follow up Falls evaluation completed Falls evaluation completed - - -    FALL RISK PREVENTION PERTAINING TO THE HOME:  Any stairs in or around the home? Yes  If so, are there any without handrails? Yes  Home free of loose throw rugs in walkways, pet beds, electrical cords, etc? Yes  Adequate lighting in your home to reduce risk of falls? Yes   ASSISTIVE DEVICES UTILIZED TO PREVENT FALLS:  Life alert? No  Use of a cane, walker or w/c?  Cane if needed Grab bars in the bathroom? Yes  Shower chair or bench in shower? Yes  Elevated toilet seat or a handicapped toilet? Yes   TIMED UP AND GO:  Was the test performed? N/A.  Length of time to ambulate 10 feet: N/A sec.    Cognitive Function:       Immunizations Immunization History  Administered Date(s) Administered   Influenza,inj,Quad PF,6+ Mos 11/11/2018   Influenza-Unspecified 12/18/2019   PFIZER(Purple Top)SARS-COV-2 Vaccination 04/09/2019, 04/30/2019, 12/14/2019    TDAP status: Up to date  Flu Vaccine status: Due, Education has been provided regarding the importance of this vaccine. Advised may receive this vaccine at local  pharmacy or Health Dept. Aware to provide a copy of the vaccination record if obtained from local pharmacy or Health Dept. Verbalized acceptance and understanding.  Pneumococcal vaccine status: Due, Education has been provided regarding the importance of this vaccine. Advised may receive this vaccine at local pharmacy or Health Dept. Aware to provide a copy of the vaccination record if obtained from local pharmacy or Health Dept. Verbalized acceptance and understanding.  Covid-19 vaccine status: Completed vaccines  Qualifies for Shingles Vaccine? Yes   Zostavax completed No   Shingrix Completed?: No.    Education has been provided regarding the importance of this vaccine. Patient has been advised to call insurance company to determine out of pocket expense if they have not yet received this vaccine. Advised may also receive vaccine at local pharmacy or Health Dept. Verbalized acceptance and understanding.  Screening Tests Health Maintenance  Topic Date Due   OPHTHALMOLOGY EXAM  Never done   Zoster Vaccines- Shingrix (1 of 2) Never done   COVID-19 Vaccine (4 - Booster for Pfizer series) 03/07/2020   FOOT EXAM  08/26/2020   INFLUENZA VACCINE  10/17/2020   HEMOGLOBIN A1C  05/12/2021   TETANUS/TDAP  03/19/2024   DEXA SCAN  Completed   HPV VACCINES  Aged Out    Health Maintenance  Health Maintenance Due  Topic Date Due   OPHTHALMOLOGY EXAM  Never done   Zoster Vaccines- Shingrix (1 of 2) Never done   COVID-19 Vaccine (4 - Booster for Pfizer series) 03/07/2020   FOOT EXAM  08/26/2020   INFLUENZA VACCINE  10/17/2020    Colorectal cancer screening: No longer required.   Mammogram status: No longer required due to aged out.  Bone Density status: Completed 03/10/2008. Results reflect: Bone density results: NORMAL. Repeat every Not listed years.  Lung Cancer Screening: (Low Dose CT Chest recommended if Age 85-80 years, 30 pack-year currently smoking OR have quit w/in 15years.) does not  qualify.   Lung Cancer Screening Referral: No  Additional Screening:  Hepatitis C Screening: does qualify; Completed  Vision Screening: Recommended annual ophthalmology exams for early detection of glaucoma and other disorders of the eye. Is the patient up to date with their annual eye exam?   Has appointment 12/12/2020. Who is the provider or what is the name of the office in which the patient attends annual eye exams? Dr. Einar Gip If pt is not established with a provider, would they like to be referred to a provider to establish care? Yes .   Dental Screening: Recommended annual dental exams for proper oral hygiene  Community Resource Referral / Chronic Care Management: CRR required this visit?  No   CCM required this visit?  No      Plan:     I have personally reviewed and noted the following in the patient's chart:   Medical and social history Use of alcohol, tobacco or illicit drugs  Current medications and supplements including opioid prescriptions.  Functional ability and status Nutritional status Physical activity Advanced directives List of other physicians Hospitalizations, surgeries, and ER visits in previous 12 months Vitals Screenings to include cognitive, depression, and falls Referrals and appointments  In addition, I have reviewed and discussed with patient certain preventive protocols, quality metrics, and best practice recommendations. A written personalized care plan for preventive services as well as general preventive health recommendations were provided to patient.     Tobe Sos, [place]   12/10/2020   Nurse Notes: Non face to face- 60 min   Carolyn Bowen , Thank you for taking time to come for your Medicare Wellness Visit. I appreciate your ongoing commitment to your health goals. Please review the following plan we discussed and let me know if I can assist you in the future.   These are the goals we discussed:  Goals   None     This is a  list of the screening recommended for you and due dates:  Health Maintenance  Topic Date Due   Eye exam for diabetics  Never done   Zoster (Shingles) Vaccine (1 of 2) Never done   COVID-19 Vaccine (4 - Booster for Pfizer series) 03/07/2020   Complete foot exam   08/26/2020   Flu Shot  10/17/2020   Hemoglobin A1C  05/12/2021   Tetanus Vaccine  03/19/2024   DEXA scan (bone density measurement)  Completed   HPV Vaccine  Aged Out

## 2020-12-21 ENCOUNTER — Other Ambulatory Visit: Payer: Self-pay

## 2020-12-21 ENCOUNTER — Ambulatory Visit
Admission: RE | Admit: 2020-12-21 | Discharge: 2020-12-21 | Disposition: A | Discharge status: Home / Self Care | Payer: Medicare Other | Source: Ambulatory Visit | Attending: Internal Medicine | Admitting: Internal Medicine

## 2020-12-21 DIAGNOSIS — Z1231 Encounter for screening mammogram for malignant neoplasm of breast: Secondary | ICD-10-CM | POA: Diagnosis not present

## 2020-12-26 NOTE — Progress Notes (Signed)
Please call patient with update.   No evidence of malignancy. Repeat mammogram in 1 year.

## 2021-01-02 DIAGNOSIS — N8111 Cystocele, midline: Secondary | ICD-10-CM | POA: Diagnosis not present

## 2021-01-02 DIAGNOSIS — Z4689 Encounter for fitting and adjustment of other specified devices: Secondary | ICD-10-CM | POA: Diagnosis not present

## 2021-01-02 DIAGNOSIS — N952 Postmenopausal atrophic vaginitis: Secondary | ICD-10-CM | POA: Diagnosis not present

## 2021-01-02 DIAGNOSIS — N816 Rectocele: Secondary | ICD-10-CM | POA: Diagnosis not present

## 2021-01-04 DIAGNOSIS — I10 Essential (primary) hypertension: Secondary | ICD-10-CM | POA: Diagnosis not present

## 2021-01-04 DIAGNOSIS — E785 Hyperlipidemia, unspecified: Secondary | ICD-10-CM | POA: Diagnosis not present

## 2021-01-09 DIAGNOSIS — B351 Tinea unguium: Secondary | ICD-10-CM | POA: Diagnosis not present

## 2021-01-09 DIAGNOSIS — L603 Nail dystrophy: Secondary | ICD-10-CM | POA: Diagnosis not present

## 2021-01-09 DIAGNOSIS — E1351 Other specified diabetes mellitus with diabetic peripheral angiopathy without gangrene: Secondary | ICD-10-CM | POA: Diagnosis not present

## 2021-01-18 DIAGNOSIS — E1165 Type 2 diabetes mellitus with hyperglycemia: Secondary | ICD-10-CM | POA: Diagnosis not present

## 2021-01-18 DIAGNOSIS — N184 Chronic kidney disease, stage 4 (severe): Secondary | ICD-10-CM | POA: Diagnosis not present

## 2021-01-18 DIAGNOSIS — N182 Chronic kidney disease, stage 2 (mild): Secondary | ICD-10-CM | POA: Diagnosis not present

## 2021-01-18 DIAGNOSIS — I10 Essential (primary) hypertension: Secondary | ICD-10-CM | POA: Diagnosis not present

## 2021-01-18 DIAGNOSIS — E0590 Thyrotoxicosis, unspecified without thyrotoxic crisis or storm: Secondary | ICD-10-CM | POA: Diagnosis not present

## 2021-01-18 DIAGNOSIS — E7800 Pure hypercholesterolemia, unspecified: Secondary | ICD-10-CM | POA: Diagnosis not present

## 2021-01-25 DIAGNOSIS — D631 Anemia in chronic kidney disease: Secondary | ICD-10-CM | POA: Diagnosis not present

## 2021-01-25 DIAGNOSIS — E0590 Thyrotoxicosis, unspecified without thyrotoxic crisis or storm: Secondary | ICD-10-CM | POA: Diagnosis not present

## 2021-01-25 DIAGNOSIS — N184 Chronic kidney disease, stage 4 (severe): Secondary | ICD-10-CM | POA: Diagnosis not present

## 2021-01-25 DIAGNOSIS — R609 Edema, unspecified: Secondary | ICD-10-CM | POA: Diagnosis not present

## 2021-01-25 DIAGNOSIS — E559 Vitamin D deficiency, unspecified: Secondary | ICD-10-CM | POA: Diagnosis not present

## 2021-01-25 DIAGNOSIS — I129 Hypertensive chronic kidney disease with stage 1 through stage 4 chronic kidney disease, or unspecified chronic kidney disease: Secondary | ICD-10-CM | POA: Diagnosis not present

## 2021-02-08 ENCOUNTER — Other Ambulatory Visit: Payer: Self-pay | Admitting: Family

## 2021-02-08 DIAGNOSIS — I10 Essential (primary) hypertension: Secondary | ICD-10-CM

## 2021-03-13 ENCOUNTER — Other Ambulatory Visit: Payer: Self-pay | Admitting: Family

## 2021-03-13 DIAGNOSIS — I10 Essential (primary) hypertension: Secondary | ICD-10-CM

## 2021-03-15 DIAGNOSIS — B351 Tinea unguium: Secondary | ICD-10-CM | POA: Diagnosis not present

## 2021-03-15 DIAGNOSIS — M21611 Bunion of right foot: Secondary | ICD-10-CM | POA: Diagnosis not present

## 2021-03-15 DIAGNOSIS — L84 Corns and callosities: Secondary | ICD-10-CM | POA: Diagnosis not present

## 2021-03-15 DIAGNOSIS — L603 Nail dystrophy: Secondary | ICD-10-CM | POA: Diagnosis not present

## 2021-03-15 DIAGNOSIS — M21612 Bunion of left foot: Secondary | ICD-10-CM | POA: Diagnosis not present

## 2021-03-15 DIAGNOSIS — E1351 Other specified diabetes mellitus with diabetic peripheral angiopathy without gangrene: Secondary | ICD-10-CM | POA: Diagnosis not present

## 2021-03-18 ENCOUNTER — Other Ambulatory Visit: Payer: Self-pay | Admitting: Family

## 2021-03-18 DIAGNOSIS — I10 Essential (primary) hypertension: Secondary | ICD-10-CM

## 2021-03-24 ENCOUNTER — Other Ambulatory Visit: Payer: Self-pay | Admitting: Family

## 2021-03-24 DIAGNOSIS — I10 Essential (primary) hypertension: Secondary | ICD-10-CM

## 2021-04-03 ENCOUNTER — Telehealth: Payer: Self-pay | Admitting: Family

## 2021-04-03 ENCOUNTER — Other Ambulatory Visit: Payer: Self-pay

## 2021-04-03 DIAGNOSIS — E1151 Type 2 diabetes mellitus with diabetic peripheral angiopathy without gangrene: Secondary | ICD-10-CM | POA: Insufficient documentation

## 2021-04-03 NOTE — Telephone Encounter (Signed)
PT called pharmacy and pharmacy tells her to contact office for refill for losartan (COZAAR) 100 MG tablet [[id number]]    Pharmacy  [id number] [address], [address] [place] [address]  Phone:  [phone]  Fax:  [phone]

## 2021-04-04 ENCOUNTER — Telehealth: Payer: Self-pay | Admitting: Family

## 2021-04-04 ENCOUNTER — Other Ambulatory Visit: Payer: Self-pay

## 2021-04-04 DIAGNOSIS — E1122 Type 2 diabetes mellitus with diabetic chronic kidney disease: Secondary | ICD-10-CM

## 2021-04-04 MED ORDER — ATORVASTATIN CALCIUM 20 MG PO TABS: 20.0000 mg | ORAL_TABLET | Freq: Every day | ORAL | 0 refills | Status: DC

## 2021-04-04 NOTE — Telephone Encounter (Signed)
Pt called in requesting a refill on her atorvastatin  Pharm- [place]

## 2021-04-04 NOTE — Telephone Encounter (Signed)
Atorvastatin refilled.  

## 2021-04-07 ENCOUNTER — Other Ambulatory Visit: Payer: Self-pay | Admitting: Family

## 2021-04-07 DIAGNOSIS — I10 Essential (primary) hypertension: Secondary | ICD-10-CM

## 2021-05-06 NOTE — Progress Notes (Signed)
Patient ID: Carolyn Bowen, female    DOB: June 22, 1927  MRN: [id number]  CC: Hypertension Follow-Up  Subjective: Carolyn Bowen is a 88 y.o. female who presents for hypertension follow-up.  Her concerns today include:  HYPERTENSION FOLLOW-UP: 11/09/2020: - Continue Amlodipine, Carvedilol, and Losartan as prescribed.   05/10/2021: Doing well on current regimen. No side effects. No issues/concerns. Denies chest pain and shortness of breath.   2. DIABETES TYPE 2 FOLLOW-UP: 11/09/2020: - Hemoglobin A1c today at goal at 7.1%, goal < 8%. This is improved from previous hemoglobin A1c of 7.6% on 05/30/2020.  - Continue diet management. Will not start any diabetic agents given good glycemic control for age and recent history of hypoglycemia on medications. Monitor every 6 months. Given parameters for home glucose monitoring that would warrant quicker follow-up.   05/10/2021: Monitoring what she eats. No issues/concerns.  Patient Active Problem List   Diagnosis Date Noted   Diabetic peripheral angiopathy ([place]) 04/03/2021   Hypercholesterolemia 11/09/2020   Hyperglycemia due to type 2 diabetes mellitus ([place]) 11/09/2020   Positive for microalbuminuria 26/33/3545   Lichen planus 62/56/3893   Anemia, chronic disease 12/30/2018   Chronic kidney disease, stage 2 (mild) 12/30/2018   Vaginal prolapse 11/11/2018   Controlled type 2 diabetes mellitus without complication, without long-term current use of insulin ([place]) 11/11/2018   Thyrotoxicosis 11/11/2018   Vaginal bleeding 11/11/2018   Hematuria, gross 11/11/2018   Essential hypertension 09/06/2014     Current Outpatient Medications on File Prior to Visit  Medication Sig Dispense Refill   glucose blood (PRODIGY NO CODING BLOOD GLUC) test strip 1 each by Other route as needed for other. Use as instructed     amLODipine (NORVASC) 10 MG tablet TAKE 1 TABLET BY MOUTH EVERY DAY 90 tablet 0   aspirin 81 MG chewable tablet Chew 81 mg by mouth daily.      atorvastatin (LIPITOR) 20 MG tablet Take 1 tablet (20 mg total) by mouth daily. 90 tablet 0   Cholecalciferol (VITAMIN D3) 50 MCG (2000 UT) capsule 1 capsule     CVS D3 50 MCG (2000 UT) CAPS Take 1 capsule by mouth daily.     esomeprazole (NEXIUM) 20 MG capsule Take 20 mg by mouth daily as needed (heartburn).     estradiol (ESTRACE) 0.1 MG/GM vaginal cream Place 1 g vaginally 2 (two) times a week.     fexofenadine (ALLEGRA) 180 MG tablet Take 180 mg by mouth daily.     furosemide (LASIX) 40 MG tablet Take 1 tablet by mouth daily as needed.     losartan (COZAAR) 100 MG tablet TAKE 1 TABLET BY MOUTH EVERY DAY 90 tablet 0   methimazole (TAPAZOLE) 5 MG tablet Take by mouth.     pseudoephedrine-guaifenesin (MUCINEX D) 60-600 MG 12 hr tablet Take 1 tablet by mouth every 12 (twelve) hours.     No current facility-administered medications on file prior to visit.    Allergies  Allergen Reactions   Codeine Other (See Comments)   Penicillin G Benzathine Other (See Comments)   Penicillins Hives   Amlodipine Besy-Benazepril Hcl Rash    Social History   Socioeconomic History   Marital status: Widowed    Spouse name: Not on file   Number of children: Not on file   Years of education: Not on file   Highest education level: Not on file  Occupational History   Not on file  Tobacco Use   Smoking status: Never   Smokeless tobacco:  Never  Vaping Use   Vaping Use: Never used  Substance and Sexual Activity   Alcohol use: No    Alcohol/week: 0.0 standard drinks   Drug use: No   Sexual activity: Not Currently  Other Topics Concern   Not on file  Social History Narrative   Not on file   Social Determinants of Health   Financial Resource Strain: Low Risk    Difficulty of Paying Living Expenses: Not very hard  Food Insecurity: No Food Insecurity   Worried About Running Out of Food in the Last Year: Never true   Ran Out of Food in the Last Year: Never true  Transportation Needs: No  Transportation Needs   Lack of Transportation (Medical): No   Lack of Transportation (Non-Medical): No  Physical Activity: Insufficiently Active   Days of Exercise per Week: 3 days   Minutes of Exercise per Session: 40 min  Stress: No Stress Concern Present   Feeling of Stress : Not at all  Social Connections: Moderately Isolated   Frequency of Communication with Friends and Family: More than three times a week   Frequency of Social Gatherings with Friends and Family: Once a week   Attends Religious Services: 1 to 4 times per year   Active Member of [employer] or Organizations: No   Attends Archivist Meetings: Never   Marital Status: Widowed  Human resources officer Violence: Not on file    Family History  Problem Relation Age of Onset   Heart disease Mother    Cancer Father    Heart disease Brother    Breast cancer Daughter     Past Surgical History:  Procedure Laterality Date   ABDOMINAL HYSTERECTOMY     APPENDECTOMY     BREAST BIOPSY Right 2009   BREAST EXCISIONAL BIOPSY Right 2002   CATARACT EXTRACTION  2019   HERNIA REPAIR      ROS: Review of Systems Negative except as stated above  PHYSICAL EXAM: BP (!) 147/84    Pulse 69    Temp 98.3 F (36.8 C)    Resp 18    Ht 5' 2.64" (1.591 m)    Wt 140 lb (63.5 kg)    SpO2 99%    BMI 25.09 kg/m   Physical Exam HENT:     Head: Normocephalic and atraumatic.  Eyes:     Extraocular Movements: Extraocular movements intact.     Conjunctiva/sclera: Conjunctivae normal.     Pupils: Pupils are equal, round, and reactive to light.  Cardiovascular:     Rate and Rhythm: Normal rate and regular rhythm.     Pulses: Normal pulses.     Heart sounds: Normal heart sounds.  Pulmonary:     Effort: Pulmonary effort is normal.     Breath sounds: Normal breath sounds.  Musculoskeletal:     Cervical back: Normal range of motion and neck supple.  Neurological:     General: No focal deficit present.     Mental Status: She is alert and  oriented to person, place, and time.  Psychiatric:        Mood and Affect: Mood normal.        Behavior: Behavior normal.   Results for orders placed or performed in visit on 05/10/21  POCT glycosylated hemoglobin (Hb A1C)  Result Value Ref Range   Hemoglobin A1C 7.5 (A) 4.0 - 5.6 %   HbA1c POC (<> result, manual entry)     HbA1c, POC (prediabetic range)  HbA1c, POC (controlled diabetic range)      ASSESSMENT AND PLAN: 1. Essential (primary) hypertension: - Blood pressure relative to goal. - Continue Amlodipine 10 mg daily as prescribed. No refills needed as of present.  - Continue Losartan 100 mg daily as prescribed. No refills needed as of present.  - Continue Carvedilol as prescribed.  - Counseled on blood pressure goal of less than 140/90, low-sodium, DASH diet, medication compliance, 150 minutes of moderate intensity exercise per week as tolerated. Discussed medication compliance, adverse effects. - Update BMP. - Follow-up with primary provider in 6 months or sooner if needed.  - Basic Metabolic Panel - carvedilol (COREG) 3.125 MG tablet; Take 1 tablet (3.125 mg total) by mouth 2 (two) times daily with a meal.  Dispense: 180 tablet; Refill: 0  2. Controlled type 2 diabetes mellitus with stage 4 chronic kidney disease, without long-term current use of insulin ([place]): - Hemoglobin A1c today at goal at 7.5%, goal < 8%.  - Continue diet management. . Will not start any diabetic agents given good glycemic control for age and recent history of hypoglycemia on medications. Monitor every 6 months. Given parameters for home glucose monitoring that would warrant quicker follow-up.  - POCT glycosylated hemoglobin (Hb A1C)   Patient was given the opportunity to ask questions.  Patient verbalized understanding of the plan and was able to repeat key elements of the plan. Patient was given clear instructions to go to Emergency Department or return to medical center if symptoms don't improve,  worsen, or new problems develop.The patient verbalized understanding.   Orders Placed This Encounter  Procedures   Basic Metabolic Panel   POCT glycosylated hemoglobin (Hb A1C)     Requested Prescriptions   Signed Prescriptions Disp Refills   carvedilol (COREG) 3.125 MG tablet 180 tablet 0    Sig: Take 1 tablet (3.125 mg total) by mouth 2 (two) times daily with a meal.    Return in about 6 months (around 11/07/2021) for Follow-Up or next available hypertension, diabetes .  Camillia Herter, NP

## 2021-05-10 ENCOUNTER — Encounter (INDEPENDENT_AMBULATORY_CARE_PROVIDER_SITE_OTHER): Payer: Self-pay

## 2021-05-10 ENCOUNTER — Ambulatory Visit (INDEPENDENT_AMBULATORY_CARE_PROVIDER_SITE_OTHER): Payer: Medicare Other | Admitting: Family

## 2021-05-10 ENCOUNTER — Other Ambulatory Visit: Payer: Self-pay

## 2021-05-10 ENCOUNTER — Encounter: Payer: Self-pay | Admitting: Family

## 2021-05-10 VITALS — BP 147/84 | HR 69 | Temp 98.3°F | Resp 18 | Ht 62.64 in | Wt 140.0 lb

## 2021-05-10 DIAGNOSIS — N184 Chronic kidney disease, stage 4 (severe): Secondary | ICD-10-CM | POA: Diagnosis not present

## 2021-05-10 DIAGNOSIS — E1122 Type 2 diabetes mellitus with diabetic chronic kidney disease: Secondary | ICD-10-CM | POA: Diagnosis not present

## 2021-05-10 DIAGNOSIS — I10 Essential (primary) hypertension: Secondary | ICD-10-CM | POA: Diagnosis not present

## 2021-05-10 LAB — POCT GLYCOSYLATED HEMOGLOBIN (HGB A1C): Hemoglobin A1C: 7.5 % — AB (ref 4.0–5.6)

## 2021-05-10 MED ORDER — CARVEDILOL 3.125 MG PO TABS: 3.1250 mg | ORAL_TABLET | Freq: Two times a day (BID) | ORAL | 0 refills | Status: DC

## 2021-05-10 NOTE — Progress Notes (Signed)
Pt presents for hypertension and diabetes follow-up A1c 7.1 on 11/09/20 up to 7.5% today

## 2021-05-10 NOTE — Progress Notes (Signed)
Diabetes discussed in office.

## 2021-05-11 ENCOUNTER — Other Ambulatory Visit: Payer: Self-pay | Admitting: Family

## 2021-05-11 DIAGNOSIS — N184 Chronic kidney disease, stage 4 (severe): Secondary | ICD-10-CM

## 2021-05-11 DIAGNOSIS — U071 COVID-19: Secondary | ICD-10-CM | POA: Diagnosis not present

## 2021-05-11 LAB — BASIC METABOLIC PANEL
BUN/Creatinine Ratio: 17 (ref 12–28)
BUN: 35 mg/dL (ref 10–36)
CO2: 22 mmol/L (ref 20–29)
Calcium: 9 mg/dL (ref 8.7–10.3)
Chloride: 104 mmol/L (ref 96–106)
Creatinine, Ser: 2.03 mg/dL — ABNORMAL HIGH (ref 0.57–1.00)
Glucose: 141 mg/dL — ABNORMAL HIGH (ref 70–99)
Potassium: 4.6 mmol/L (ref 3.5–5.2)
Sodium: 142 mmol/L (ref 134–144)
eGFR: 23 mL/min/{1.73_m2} — ABNORMAL LOW (ref >59)

## 2021-05-11 NOTE — Progress Notes (Signed)
Please call patient with update.   Kidney function very low. Urgent referral placed to Nephrology for further evaluation and management. Their office should call within 2 weeks with appointment details.   Confirm with patient if she already has a kidney doctor and the name. If she does she should give them a call today to get scheduled to see them soon.

## 2021-05-24 DIAGNOSIS — L84 Corns and callosities: Secondary | ICD-10-CM | POA: Diagnosis not present

## 2021-05-24 DIAGNOSIS — M21612 Bunion of left foot: Secondary | ICD-10-CM | POA: Diagnosis not present

## 2021-05-24 DIAGNOSIS — M21611 Bunion of right foot: Secondary | ICD-10-CM | POA: Diagnosis not present

## 2021-05-24 DIAGNOSIS — L603 Nail dystrophy: Secondary | ICD-10-CM | POA: Diagnosis not present

## 2021-05-24 DIAGNOSIS — B351 Tinea unguium: Secondary | ICD-10-CM | POA: Diagnosis not present

## 2021-05-24 DIAGNOSIS — N184 Chronic kidney disease, stage 4 (severe): Secondary | ICD-10-CM | POA: Diagnosis not present

## 2021-05-24 DIAGNOSIS — E1351 Other specified diabetes mellitus with diabetic peripheral angiopathy without gangrene: Secondary | ICD-10-CM | POA: Diagnosis not present

## 2021-05-29 DIAGNOSIS — N184 Chronic kidney disease, stage 4 (severe): Secondary | ICD-10-CM | POA: Diagnosis not present

## 2021-05-29 DIAGNOSIS — E559 Vitamin D deficiency, unspecified: Secondary | ICD-10-CM | POA: Diagnosis not present

## 2021-05-29 DIAGNOSIS — D631 Anemia in chronic kidney disease: Secondary | ICD-10-CM | POA: Diagnosis not present

## 2021-05-29 DIAGNOSIS — R609 Edema, unspecified: Secondary | ICD-10-CM | POA: Diagnosis not present

## 2021-05-29 DIAGNOSIS — I129 Hypertensive chronic kidney disease with stage 1 through stage 4 chronic kidney disease, or unspecified chronic kidney disease: Secondary | ICD-10-CM | POA: Diagnosis not present

## 2021-06-15 ENCOUNTER — Other Ambulatory Visit: Payer: Self-pay | Admitting: Family

## 2021-06-15 DIAGNOSIS — I10 Essential (primary) hypertension: Secondary | ICD-10-CM

## 2021-06-18 ENCOUNTER — Other Ambulatory Visit: Payer: Self-pay | Admitting: Family

## 2021-06-18 DIAGNOSIS — I10 Essential (primary) hypertension: Secondary | ICD-10-CM

## 2021-06-30 ENCOUNTER — Other Ambulatory Visit: Payer: Self-pay | Admitting: Family

## 2021-06-30 DIAGNOSIS — E1122 Type 2 diabetes mellitus with diabetic chronic kidney disease: Secondary | ICD-10-CM

## 2021-07-05 DIAGNOSIS — E0590 Thyrotoxicosis, unspecified without thyrotoxic crisis or storm: Secondary | ICD-10-CM | POA: Diagnosis not present

## 2021-07-05 DIAGNOSIS — E785 Hyperlipidemia, unspecified: Secondary | ICD-10-CM | POA: Diagnosis not present

## 2021-07-05 DIAGNOSIS — I10 Essential (primary) hypertension: Secondary | ICD-10-CM | POA: Diagnosis not present

## 2021-07-05 DIAGNOSIS — I739 Peripheral vascular disease, unspecified: Secondary | ICD-10-CM | POA: Diagnosis not present

## 2021-07-05 DIAGNOSIS — E119 Type 2 diabetes mellitus without complications: Secondary | ICD-10-CM | POA: Diagnosis not present

## 2021-07-12 DIAGNOSIS — Z4689 Encounter for fitting and adjustment of other specified devices: Secondary | ICD-10-CM | POA: Diagnosis not present

## 2021-07-12 DIAGNOSIS — N816 Rectocele: Secondary | ICD-10-CM | POA: Diagnosis not present

## 2021-07-12 DIAGNOSIS — N8110 Cystocele, unspecified: Secondary | ICD-10-CM | POA: Diagnosis not present

## 2021-07-19 DIAGNOSIS — E1165 Type 2 diabetes mellitus with hyperglycemia: Secondary | ICD-10-CM | POA: Diagnosis not present

## 2021-07-19 DIAGNOSIS — E0590 Thyrotoxicosis, unspecified without thyrotoxic crisis or storm: Secondary | ICD-10-CM | POA: Diagnosis not present

## 2021-07-26 DIAGNOSIS — N182 Chronic kidney disease, stage 2 (mild): Secondary | ICD-10-CM | POA: Diagnosis not present

## 2021-07-26 DIAGNOSIS — I10 Essential (primary) hypertension: Secondary | ICD-10-CM | POA: Diagnosis not present

## 2021-07-26 DIAGNOSIS — E7800 Pure hypercholesterolemia, unspecified: Secondary | ICD-10-CM | POA: Diagnosis not present

## 2021-07-26 DIAGNOSIS — E0590 Thyrotoxicosis, unspecified without thyrotoxic crisis or storm: Secondary | ICD-10-CM | POA: Diagnosis not present

## 2021-07-26 DIAGNOSIS — E1165 Type 2 diabetes mellitus with hyperglycemia: Secondary | ICD-10-CM | POA: Diagnosis not present

## 2021-08-30 DIAGNOSIS — M21611 Bunion of right foot: Secondary | ICD-10-CM | POA: Diagnosis not present

## 2021-08-30 DIAGNOSIS — L603 Nail dystrophy: Secondary | ICD-10-CM | POA: Diagnosis not present

## 2021-08-30 DIAGNOSIS — M21612 Bunion of left foot: Secondary | ICD-10-CM | POA: Diagnosis not present

## 2021-08-30 DIAGNOSIS — B351 Tinea unguium: Secondary | ICD-10-CM | POA: Diagnosis not present

## 2021-08-30 DIAGNOSIS — L84 Corns and callosities: Secondary | ICD-10-CM | POA: Diagnosis not present

## 2021-08-30 DIAGNOSIS — E1351 Other specified diabetes mellitus with diabetic peripheral angiopathy without gangrene: Secondary | ICD-10-CM | POA: Diagnosis not present

## 2021-09-11 ENCOUNTER — Other Ambulatory Visit: Payer: Self-pay | Admitting: Family

## 2021-09-11 DIAGNOSIS — I10 Essential (primary) hypertension: Secondary | ICD-10-CM

## 2021-09-16 ENCOUNTER — Other Ambulatory Visit: Payer: Self-pay | Admitting: Family

## 2021-09-16 DIAGNOSIS — I10 Essential (primary) hypertension: Secondary | ICD-10-CM

## 2021-09-18 NOTE — Telephone Encounter (Signed)
Requested medications are due for refill today.  yes  Requested medications are on the active medications list.  yes  Last refill. 05/10/2021 #180 0 refills  Future visit scheduled.   yes  Notes to clinic.  Labs are expired.    Requested Prescriptions  Pending Prescriptions Disp Refills   carvedilol (COREG) 3.125 MG tablet [Pharmacy Med Name: CARVEDILOL 3.125 MG TABLET] 180 tablet 0    Sig: TAKE 1 TABLET BY MOUTH TWICE A DAY WITH A MEAL     Cardiovascular: Beta Blockers 3 Failed - 09/16/2021 12:10 PM      Failed - Cr in normal range and within 360 days    Creatinine, Ser  Date Value Ref Range Status  05/10/2021 2.03 (H) 0.57 - 1.00 mg/dL Final         Failed - AST in normal range and within 360 days    AST  Date Value Ref Range Status  11/11/2018 19 0 - 40 IU/L Final         Failed - ALT in normal range and within 360 days    ALT  Date Value Ref Range Status  11/11/2018 18 0 - 32 IU/L Final         Failed - Last BP in normal range    BP Readings from Last 1 Encounters:  05/10/21 (!) 147/84         Passed - Last Heart Rate in normal range    Pulse Readings from Last 1 Encounters:  05/10/21 69         Passed - Valid encounter within last 6 months    Recent Outpatient Visits           4 months ago Essential (primary) hypertension   Primary Care at [facility], Amy J, NP   10 months ago Essential hypertension   Primary Care at [facility], Amy J, NP   1 year ago Controlled type 2 diabetes mellitus with stage 4 chronic kidney disease, without long-term current use of insulin [facility])   Primary Care at [facility], Bayard Beaver, MD   1 year ago Controlled type 2 diabetes mellitus without complication, without long-term current use of insulin [facility])   [place], Jarome Matin, RPH-CPP   1 year ago Essential hypertension   Primary Care at [facility], Flonnie Hailstone, NP       Future  Appointments             In 1 month Camillia Herter, NP Primary Care at [facility]

## 2021-09-22 ENCOUNTER — Other Ambulatory Visit: Payer: Self-pay | Admitting: Family

## 2021-09-22 ENCOUNTER — Telehealth: Payer: Self-pay

## 2021-09-22 DIAGNOSIS — I10 Essential (primary) hypertension: Secondary | ICD-10-CM

## 2021-09-22 MED ORDER — CARVEDILOL 3.125 MG PO TABS
3.1250 mg | ORAL_TABLET | Freq: Two times a day (BID) | ORAL | 0 refills | Status: DC
Start: 2021-09-22 — End: 2021-10-15

## 2021-09-22 NOTE — Telephone Encounter (Signed)
Requested medication (s) are due for refill today: yes  Requested medication (s) are on the active medication list: yes  Last refill:  06/15/21 #90 0 refills  Future visit scheduled: yes in 1 month  Notes to clinic:  protocol failed. Last labs 05/10/21 do you want to refill Rx?     Requested Prescriptions  Pending Prescriptions Disp Refills   losartan (COZAAR) 100 MG tablet [Pharmacy Med Name: LOSARTAN POTASSIUM 100 MG TAB] 90 tablet 0    Sig: TAKE 1 TABLET BY MOUTH EVERY DAY     Cardiovascular:  Angiotensin Receptor Blockers Failed - 09/22/2021  2:09 PM      Failed - Cr in normal range and within 180 days    Creatinine, Ser  Date Value Ref Range Status  05/10/2021 2.03 (H) 0.57 - 1.00 mg/dL Final         Failed - Last BP in normal range    BP Readings from Last 1 Encounters:  05/10/21 (!) 147/84         Passed - K in normal range and within 180 days    Potassium  Date Value Ref Range Status  05/10/2021 4.6 3.5 - 5.2 mmol/L Final  08/03/2019 5.0  Final         Passed - Patient is not pregnant      Passed - Valid encounter within last 6 months    Recent Outpatient Visits           4 months ago Essential (primary) hypertension   Primary Care at [facility], [place], NP   10 months ago Essential hypertension   Primary Care at [facility], [place], NP   1 year ago Controlled type 2 diabetes mellitus with stage 4 chronic kidney disease, without long-term current use of insulin [facility])   Primary Care at [facility], Bayard Beaver, MD   1 year ago Controlled type 2 diabetes mellitus without complication, without long-term current use of insulin [facility])   [place], RPH-CPP   1 year ago Essential hypertension   Primary Care at [facility], Flonnie Hailstone, NP       Future Appointments             In 1 month Camillia Herter, NP Primary Care at [facility]

## 2021-09-22 NOTE — Telephone Encounter (Signed)
Order complete. Make patient aware she needs OV for additional refills.

## 2021-09-22 NOTE — Telephone Encounter (Signed)
Ok to refill 

## 2021-09-22 NOTE — Telephone Encounter (Signed)
Pt called to follow up on carvedilol refill. Pt states that it has been 2 weeks since requested and medicine has not been refilled.   Please advise.

## 2021-09-25 DIAGNOSIS — Z6825 Body mass index (BMI) 25.0-25.9, adult: Secondary | ICD-10-CM | POA: Diagnosis not present

## 2021-09-25 DIAGNOSIS — Z90711 Acquired absence of uterus with remaining cervical stump: Secondary | ICD-10-CM | POA: Diagnosis not present

## 2021-09-25 DIAGNOSIS — Z124 Encounter for screening for malignant neoplasm of cervix: Secondary | ICD-10-CM | POA: Diagnosis not present

## 2021-09-25 DIAGNOSIS — Z01419 Encounter for gynecological examination (general) (routine) without abnormal findings: Secondary | ICD-10-CM | POA: Diagnosis not present

## 2021-09-25 DIAGNOSIS — Z4689 Encounter for fitting and adjustment of other specified devices: Secondary | ICD-10-CM | POA: Diagnosis not present

## 2021-09-25 DIAGNOSIS — Z01411 Encounter for gynecological examination (general) (routine) with abnormal findings: Secondary | ICD-10-CM | POA: Diagnosis not present

## 2021-09-25 DIAGNOSIS — Z0142 Encounter for cervical smear to confirm findings of recent normal smear following initial abnormal smear: Secondary | ICD-10-CM | POA: Diagnosis not present

## 2021-09-25 DIAGNOSIS — N951 Menopausal and female climacteric states: Secondary | ICD-10-CM | POA: Diagnosis not present

## 2021-09-25 NOTE — Telephone Encounter (Signed)
I have attempted without success to contact this patient by phone to without success

## 2021-09-28 ENCOUNTER — Other Ambulatory Visit: Payer: Self-pay | Admitting: Family

## 2021-09-28 ENCOUNTER — Other Ambulatory Visit: Payer: Self-pay | Admitting: Obstetrics and Gynecology

## 2021-09-28 DIAGNOSIS — E2839 Other primary ovarian failure: Secondary | ICD-10-CM

## 2021-09-28 DIAGNOSIS — I10 Essential (primary) hypertension: Secondary | ICD-10-CM

## 2021-09-28 DIAGNOSIS — E1122 Type 2 diabetes mellitus with diabetic chronic kidney disease: Secondary | ICD-10-CM

## 2021-09-28 NOTE — Telephone Encounter (Signed)
Medication Refill - Medication: atorvastatin (LIPITOR) 20 MG tablet / 90 day supply   Pt also mentioned her Carvedilol was refilled but only for 30 day supply and she would like 90 day supply for that as well due to having a hard time going back and forth to pharmacy / please advise   Has the patient contacted their pharmacy? Yes.   (Agent: If no, request that the patient contact the pharmacy for the refill. If patient does not wish to contact the pharmacy document the reason why and proceed with request.) (Agent: If yes, when and what did the pharmacy advise?)  Preferred Pharmacy (with phone number or street name): [id number] [place], [place].  [address] [place] [address]  Phone:  [phone]  Fax:  [phone]   Has the patient been seen for an appointment in the last year OR does the patient have an upcoming appointment? Yes.    Agent: Please be advised that RX refills may take up to 3 business days. We ask that you follow-up with your pharmacy.

## 2021-09-28 NOTE — Telephone Encounter (Signed)
LM on Pts VM re: refill and to keep her August appt.

## 2021-09-28 NOTE — Telephone Encounter (Signed)
Requested medication (s) are due for refill today:   Yes for Coreg  Requested medication (s) are on the active medication list:   Yes for both  Future visit scheduled:   Yes   Last ordered: Coreg 09/22/2021 requesting a 90 day supply due to difficulty getting to the pharmacy.     Requested Prescriptions  Pending Prescriptions Disp Refills   atorvastatin (LIPITOR) 20 MG tablet 90 tablet 0    Sig: Take 1 tablet (20 mg total) by mouth daily.     Cardiovascular:  Antilipid - Statins Failed - 09/28/2021  2:38 PM      Failed - Lipid Panel in normal range within the last 12 months    Cholesterol, Total  Date Value Ref Range Status  11/11/2018 133 100 - 199 mg/dL Final   LDL Calculated  Date Value Ref Range Status  11/11/2018 51 0 - 99 mg/dL Final    Comment:    **Effective November 17, 2018, [facility] is implementing an improved** equation to calculate Low Density Lipoprotein Cholesterol (LDL-C) concentrations, to be used in all lipid panels that report calculated LDL-C. This equation was developed through a collaboration with the [employer], Lung and [place] (NIH).[1] The NIH calculation overcomes the limitations of the existing Friedewald LDL-C equation and performs equally well in both fasting and non-fasting individuals. 1. Pauline Good Q, et al. A new equation for calculation of low-density lipoprotein cholesterol in patients with normolipidemia and/or hypertriglyceridemia. JAMA Cardiol. 2020 Feb 26. doi:10.1001/jamacardio.2020.0013    HDL  Date Value Ref Range Status  11/11/2018 55 >39 mg/dL Final   Triglycerides  Date Value Ref Range Status  11/11/2018 134 0 - 149 mg/dL Final         Passed - Patient is not pregnant      Passed - Valid encounter within last 12 months    Recent Outpatient Visits           4 months ago Essential (primary) hypertension   Primary Care at [facility], [place], NP   10 months ago Essential  hypertension   Primary Care at [facility], [place], NP   1 year ago Controlled type 2 diabetes mellitus with stage 4 chronic kidney disease, without long-term current use of insulin [facility])   Primary Care at [facility], Bayard Beaver, MD   1 year ago Controlled type 2 diabetes mellitus without complication, without long-term current use of insulin ([place])   [place], RPH-CPP   1 year ago Essential hypertension   Primary Care at [facility], [place], NP       Future Appointments             In 1 month Camillia Herter, NP Primary Care at [facility]             carvedilol (COREG) 3.125 MG tablet 60 tablet 0    Sig: Take 1 tablet (3.125 mg total) by mouth 2 (two) times daily with a meal.     Cardiovascular: Beta Blockers 3 Failed - 09/28/2021  2:38 PM      Failed - Cr in normal range and within 360 days    Creatinine, Ser  Date Value Ref Range Status  05/10/2021 2.03 (H) 0.57 - 1.00 mg/dL Final         Failed - AST in normal range and within 360 days    AST  Date Value Ref Range Status  11/11/2018 19 0 - 40 IU/L Final         Failed - ALT in normal range and within 360 days    ALT  Date Value Ref Range Status  11/11/2018 18 0 - 32 IU/L Final         Failed - Last BP in normal range    BP Readings from Last 1 Encounters:  05/10/21 (!) 147/84         Passed - Last Heart Rate in normal range    Pulse Readings from Last 1 Encounters:  05/10/21 69         Passed - Valid encounter within last 6 months    Recent Outpatient Visits           4 months ago Essential (primary) hypertension   Primary Care at [facility], Amy J, NP   10 months ago Essential hypertension   Primary Care at [facility], Amy J, NP   1 year ago Controlled type 2 diabetes mellitus with stage 4 chronic kidney disease, without long-term current use of insulin [facility])   Primary Care at  [facility], Bayard Beaver, MD   1 year ago Controlled type 2 diabetes mellitus without complication, without long-term current use of insulin [facility])   Mitchell, RPH-CPP   1 year ago Essential hypertension   Primary Care at [facility], [place], NP       Future Appointments             In 1 month Camillia Herter, NP Primary Care at [facility]

## 2021-10-02 NOTE — Telephone Encounter (Signed)
Pt called in for assistance. Pt says that she still hasn't received her 90 day supply of Atorvastatin. Pt says that she has received her 90 day supply of   carvedilol (COREG    Pt would like further assistance receiving her Atorvastatin Rx, she says that she is completely out.    Please assist pt further.

## 2021-10-04 ENCOUNTER — Other Ambulatory Visit: Payer: Self-pay

## 2021-10-04 ENCOUNTER — Other Ambulatory Visit: Payer: Self-pay | Admitting: Family

## 2021-10-04 DIAGNOSIS — E0590 Thyrotoxicosis, unspecified without thyrotoxic crisis or storm: Secondary | ICD-10-CM | POA: Diagnosis not present

## 2021-10-04 DIAGNOSIS — E1122 Type 2 diabetes mellitus with diabetic chronic kidney disease: Secondary | ICD-10-CM

## 2021-10-04 DIAGNOSIS — I10 Essential (primary) hypertension: Secondary | ICD-10-CM | POA: Diagnosis not present

## 2021-10-04 DIAGNOSIS — I739 Peripheral vascular disease, unspecified: Secondary | ICD-10-CM | POA: Diagnosis not present

## 2021-10-04 DIAGNOSIS — E785 Hyperlipidemia, unspecified: Secondary | ICD-10-CM | POA: Diagnosis not present

## 2021-10-04 DIAGNOSIS — E119 Type 2 diabetes mellitus without complications: Secondary | ICD-10-CM | POA: Diagnosis not present

## 2021-10-04 MED ORDER — ATORVASTATIN CALCIUM 20 MG PO TABS: 20.0000 mg | ORAL_TABLET | Freq: Every day | ORAL | 0 refills | Status: DC

## 2021-10-04 NOTE — Telephone Encounter (Signed)
Pt called and is very upset that again she has had to wait so long to get her Atorvastatin. She is behind on labs but has appt soon. Please fill.

## 2021-10-04 NOTE — Telephone Encounter (Signed)
Requested medication (s) are due for refill today: yes  Requested medication (s) are on the active medication list: yes  Last refill:  april  Future visit scheduled: August  Notes to clinic:  Pt called very upset. She requested this med a week ago and has not heard anything. She is in need of lipid labs but has appt next week. She is now off her medication and is worried about being off of it and if lab will be correct. Please fill this today.      Requested Prescriptions  Pending Prescriptions Disp Refills   atorvastatin (LIPITOR) 20 MG tablet [Pharmacy Med Name: ATORVASTATIN 20 MG TABLET] 90 tablet 0    Sig: TAKE 1 TABLET BY MOUTH EVERY DAY     Cardiovascular:  Antilipid - Statins Failed - 10/04/2021  9:51 AM      Failed - Lipid Panel in normal range within the last 12 months    Cholesterol, Total  Date Value Ref Range Status  11/11/2018 133 100 - 199 mg/dL Final   LDL Calculated  Date Value Ref Range Status  11/11/2018 51 0 - 99 mg/dL Final    Comment:    **Effective November 17, 2018, [facility] is implementing an improved** equation to calculate Low Density Lipoprotein Cholesterol (LDL-C) concentrations, to be used in all lipid panels that report calculated LDL-C. This equation was developed through a collaboration with the [employer], Lung and [place] (NIH).[1] The NIH calculation overcomes the limitations of the existing Friedewald LDL-C equation and performs equally well in both fasting and non-fasting individuals. 1. Pauline Good Q, et al. A new equation for calculation of low-density lipoprotein cholesterol in patients with normolipidemia and/or hypertriglyceridemia. JAMA Cardiol. 2020 Feb 26. doi:10.1001/jamacardio.2020.0013    HDL  Date Value Ref Range Status  11/11/2018 55 >39 mg/dL Final   Triglycerides  Date Value Ref Range Status  11/11/2018 134 0 - 149 mg/dL Final         Passed - Patient is not pregnant      Passed -  Valid encounter within last 12 months    Recent Outpatient Visits           4 months ago Essential (primary) hypertension   Primary Care at [facility], [place], NP   10 months ago Essential hypertension   Primary Care at [facility], [place], NP   1 year ago Controlled type 2 diabetes mellitus with stage 4 chronic kidney disease, without long-term current use of insulin [facility])   Primary Care at [facility], Bayard Beaver, MD   1 year ago Controlled type 2 diabetes mellitus without complication, without long-term current use of insulin [facility])   [place], RPH-CPP   1 year ago Essential hypertension   Primary Care at [facility], Flonnie Hailstone, NP       Future Appointments             In 3 weeks Camillia Herter, NP Primary Care at [facility]

## 2021-10-11 DIAGNOSIS — N8111 Cystocele, midline: Secondary | ICD-10-CM | POA: Diagnosis not present

## 2021-10-11 DIAGNOSIS — N816 Rectocele: Secondary | ICD-10-CM | POA: Diagnosis not present

## 2021-10-11 DIAGNOSIS — Z4689 Encounter for fitting and adjustment of other specified devices: Secondary | ICD-10-CM | POA: Diagnosis not present

## 2021-10-15 ENCOUNTER — Other Ambulatory Visit: Payer: Self-pay | Admitting: Family

## 2021-10-15 DIAGNOSIS — I10 Essential (primary) hypertension: Secondary | ICD-10-CM

## 2021-10-15 NOTE — Telephone Encounter (Signed)
Keep appointment scheduled 10/30/2021. Refilled per patient request.

## 2021-10-23 NOTE — Progress Notes (Signed)
Patient ID: Carolyn Bowen, female    DOB: 02-04-1928  MRN: [id number]  CC: Chronic Care Management  Subjective: Carolyn Bowen is a 88 y.o. female who presents for chronic care management.   Her concerns today include:  Doing well on blood pressure medications without issues or concerns. Checking blood pressures at home. Reports recent appointment with Charolette Forward, MD at cardiology. Next appointment with the same November 2023. Reports all of her medications refills are coming under primary care provider and unsure why. She is still established with nephrology.   Patient Active Problem List   Diagnosis Date Noted   Diabetic peripheral angiopathy ([place]) 04/03/2021   Hypercholesterolemia 11/09/2020   Hyperglycemia due to type 2 diabetes mellitus ([place]) 11/09/2020   Positive for microalbuminuria 37/62/8315   Lichen planus 17/61/6073   Anemia, chronic disease 12/30/2018   Chronic kidney disease, stage 2 (mild) 12/30/2018   Vaginal prolapse 11/11/2018   Controlled type 2 diabetes mellitus without complication, without long-term current use of insulin ([place]) 11/11/2018   Thyrotoxicosis 11/11/2018   Vaginal bleeding 11/11/2018   Hematuria, gross 11/11/2018   Essential hypertension 09/06/2014     Current Outpatient Medications on File Prior to Visit  Medication Sig Dispense Refill   aspirin 81 MG chewable tablet Chew 81 mg by mouth daily.     Cholecalciferol (VITAMIN D3) 50 MCG (2000 UT) capsule 1 capsule     CVS D3 50 MCG (2000 UT) CAPS Take 1 capsule by mouth daily.     esomeprazole (NEXIUM) 20 MG capsule Take 20 mg by mouth daily as needed (heartburn).     estradiol (ESTRACE) 0.1 MG/GM vaginal cream Place 1 g vaginally 2 (two) times a week.     fexofenadine (ALLEGRA) 180 MG tablet Take 180 mg by mouth daily.     furosemide (LASIX) 40 MG tablet Take 1 tablet by mouth daily as needed.     glucose blood (PRODIGY NO CODING BLOOD GLUC) test strip 1 each by Other route as needed for  other. Use as instructed     methimazole (TAPAZOLE) 5 MG tablet Take by mouth.     pseudoephedrine-guaifenesin (MUCINEX D) 60-600 MG 12 hr tablet Take 1 tablet by mouth every 12 (twelve) hours.     No current facility-administered medications on file prior to visit.    Allergies  Allergen Reactions   Codeine Other (See Comments)   Penicillin G Benzathine Other (See Comments)   Penicillins Hives   Amlodipine Besy-Benazepril Hcl Rash    Social History   Socioeconomic History   Marital status: Widowed    Spouse name: Not on file   Number of children: Not on file   Years of education: Not on file   Highest education level: Not on file  Occupational History   Not on file  Tobacco Use   Smoking status: Never    Passive exposure: Never   Smokeless tobacco: Never  Vaping Use   Vaping Use: Never used  Substance and Sexual Activity   Alcohol use: No    Alcohol/week: 0.0 standard drinks of alcohol   Drug use: No   Sexual activity: Not Currently  Other Topics Concern   Not on file  Social History Narrative   Not on file   Social Determinants of Health   Financial Resource Strain: Low Risk  (12/10/2020)   Overall Financial Resource Strain (CARDIA)    Difficulty of Paying Living Expenses: Not very hard  Food Insecurity: No Food Insecurity (12/10/2020)  Hunger Vital Sign    Worried About Running Out of Food in the Last Year: Never true    Ran Out of Food in the Last Year: Never true  Transportation Needs: No Transportation Needs (12/10/2020)   PRAPARE - Hydrologist (Medical): No    Lack of Transportation (Non-Medical): No  Physical Activity: Insufficiently Active (12/10/2020)   Exercise Vital Sign    Days of Exercise per Week: 3 days    Minutes of Exercise per Session: 40 min  Stress: No Stress Concern Present (12/10/2020)   [place]    Feeling of Stress : Not at all  Social  Connections: Moderately Isolated (12/10/2020)   Social Connection and Isolation Panel [NHANES]    Frequency of Communication with Friends and Family: More than three times a week    Frequency of Social Gatherings with Friends and Family: Once a week    Attends Religious Services: 1 to 4 times per year    Active Member of [employer] or Organizations: No    Attends Archivist Meetings: Never    Marital Status: Widowed  Human resources officer Violence: Not on file    Family History  Problem Relation Age of Onset   Heart disease Mother    Cancer Father    Heart disease Brother    Breast cancer Daughter     Past Surgical History:  Procedure Laterality Date   ABDOMINAL HYSTERECTOMY     APPENDECTOMY     BREAST BIOPSY Right 2009   BREAST EXCISIONAL BIOPSY Right 2002   CATARACT EXTRACTION  2019   HERNIA REPAIR      ROS: Review of Systems Negative except as stated above  PHYSICAL EXAM: BP (!) 148/64 (BP Location: Left Arm, Patient Position: Sitting, Cuff Size: Normal)   Pulse 70   Temp 98.3 F (36.8 C)   Resp 16   Ht 5' 2.64" (1.591 m)   Wt 140 lb (63.5 kg)   SpO2 98%   BMI 25.09 kg/m    Physical Exam HENT:     Head: Normocephalic and atraumatic.  Eyes:     Extraocular Movements: Extraocular movements intact.     Conjunctiva/sclera: Conjunctivae normal.     Pupils: Pupils are equal, round, and reactive to light.  Cardiovascular:     Rate and Rhythm: Normal rate and regular rhythm.     Pulses: Normal pulses.     Heart sounds: Normal heart sounds.  Pulmonary:     Effort: Pulmonary effort is normal.     Breath sounds: Normal breath sounds.  Musculoskeletal:     Cervical back: Normal range of motion and neck supple.  Neurological:     General: No focal deficit present.     Mental Status: She is alert and oriented to person, place, and time.  Psychiatric:        Mood and Affect: Mood normal.        Behavior: Behavior normal.    ASSESSMENT AND PLAN: 1. Essential  (primary) hypertension - Continue Amlodipine, Carvedilol, and Losartan as prescribed.  - Counseled on blood pressure goal of less than 140/90, low-sodium, DASH diet, medication compliance, 150 minutes of moderate intensity exercise per week as tolerated. Discussed medication compliance, adverse effects. - Keep all scheduled appointments with Charolette Forward, MD.  - amLODipine (NORVASC) 10 MG tablet; Take 1 tablet (10 mg total) by mouth daily.  Dispense: 30 tablet; Refill: 3 - carvedilol (COREG) 3.125 MG  tablet; TAKE 1 TABLET BY MOUTH TWICE A DAY WITH A MEAL  Dispense: 60 tablet; Refill: 3 - losartan (COZAAR) 100 MG tablet; Take 1 tablet (100 mg total) by mouth daily.  Dispense: 30 tablet; Refill: 3  2. Controlled type 2 diabetes mellitus with stage 4 chronic kidney disease, without long-term current use of insulin ([facility]) - Result pending. - Hemoglobin A1c  3. Hypercholesterolemia - Continue Atorvastatin as prescribed.  - Update lipid panel. - Lipid Panel - atorvastatin (LIPITOR) 20 MG tablet; Take 1 tablet (20 mg total) by mouth daily.  Dispense: 90 tablet; Refill: 1    Patient was given the opportunity to ask questions.  Patient verbalized understanding of the plan and was able to repeat key elements of the plan. Patient was given clear instructions to go to Emergency Department or return to medical center if symptoms don't improve, worsen, or new problems develop.The patient verbalized understanding.   Orders Placed This Encounter  Procedures   Lipid Panel   Hemoglobin A1c     Requested Prescriptions   Signed Prescriptions Disp Refills   amLODipine (NORVASC) 10 MG tablet 30 tablet 3    Sig: Take 1 tablet (10 mg total) by mouth daily.   atorvastatin (LIPITOR) 20 MG tablet 90 tablet 1    Sig: Take 1 tablet (20 mg total) by mouth daily.   carvedilol (COREG) 3.125 MG tablet 60 tablet 3    Sig: TAKE 1 TABLET BY MOUTH TWICE A DAY WITH A MEAL   losartan (COZAAR) 100 MG tablet 30 tablet  3    Sig: Take 1 tablet (100 mg total) by mouth daily.    Return in about 4 months (around 03/01/2022) for Follow-Up or next available chronic care mgmt.  Camillia Herter, NP

## 2021-10-30 ENCOUNTER — Encounter: Payer: Self-pay | Admitting: Family

## 2021-10-30 ENCOUNTER — Ambulatory Visit (INDEPENDENT_AMBULATORY_CARE_PROVIDER_SITE_OTHER): Payer: Medicare Other | Admitting: Family

## 2021-10-30 VITALS — BP 148/64 | HR 70 | Temp 98.3°F | Resp 16 | Ht 62.64 in | Wt 140.0 lb

## 2021-10-30 DIAGNOSIS — I10 Essential (primary) hypertension: Secondary | ICD-10-CM | POA: Diagnosis not present

## 2021-10-30 DIAGNOSIS — E1122 Type 2 diabetes mellitus with diabetic chronic kidney disease: Secondary | ICD-10-CM

## 2021-10-30 DIAGNOSIS — N184 Chronic kidney disease, stage 4 (severe): Secondary | ICD-10-CM

## 2021-10-30 DIAGNOSIS — E7800 Pure hypercholesterolemia, unspecified: Secondary | ICD-10-CM | POA: Diagnosis not present

## 2021-10-30 MED ORDER — ATORVASTATIN CALCIUM 20 MG PO TABS
20.0000 mg | ORAL_TABLET | Freq: Every day | ORAL | 1 refills | Status: DC
Start: 2021-10-30 — End: 2022-05-14

## 2021-10-30 MED ORDER — LOSARTAN POTASSIUM 100 MG PO TABS: 100.0000 mg | ORAL_TABLET | Freq: Every day | ORAL | 3 refills | Status: DC

## 2021-10-30 MED ORDER — CARVEDILOL 3.125 MG PO TABS: ORAL_TABLET | ORAL | 3 refills | Status: DC

## 2021-10-30 MED ORDER — AMLODIPINE BESYLATE 10 MG PO TABS: 10.0000 mg | ORAL_TABLET | Freq: Every day | ORAL | 3 refills | Status: DC

## 2021-10-30 NOTE — Progress Notes (Signed)
.  Pt presents for chronic care management   

## 2021-10-30 NOTE — Patient Instructions (Signed)
Hypertension, Adult High blood pressure (hypertension) is when the force of blood pumping through the arteries is too strong. The arteries are the blood vessels that carry blood from the heart throughout the body. Hypertension forces the heart to work harder to pump blood and may cause arteries to become narrow or stiff. Untreated or uncontrolled hypertension can lead to a heart attack, heart failure, a stroke, kidney disease, and other problems. A blood pressure reading consists of a higher number over a lower number. Ideally, your blood pressure should be below 120/80. The first ("top") number is called the systolic pressure. It is a measure of the pressure in your arteries as your heart beats. The second ("bottom") number is called the diastolic pressure. It is a measure of the pressure in your arteries as the heart relaxes. What are the causes? The exact cause of this condition is not known. There are some conditions that result in high blood pressure. What increases the risk? Certain factors may make you more likely to develop high blood pressure. Some of these risk factors are under your control, including: Smoking. Not getting enough exercise or physical activity. Being overweight. Having too much fat, sugar, calories, or salt (sodium) in your diet. Drinking too much alcohol. Other risk factors include: Having a personal history of heart disease, diabetes, high cholesterol, or kidney disease. Stress. Having a family history of high blood pressure and high cholesterol. Having obstructive sleep apnea. Age. The risk increases with age. What are the signs or symptoms? High blood pressure may not cause symptoms. Very high blood pressure (hypertensive crisis) may cause: Headache. Fast or irregular heartbeats (palpitations). Shortness of breath. Nosebleed. Nausea and vomiting. Vision changes. Severe chest pain, dizziness, and seizures. How is this diagnosed? This condition is diagnosed by  measuring your blood pressure while you are seated, with your arm resting on a flat surface, your legs uncrossed, and your feet flat on the floor. The cuff of the blood pressure monitor will be placed directly against the skin of your upper arm at the level of your heart. Blood pressure should be measured at least twice using the same arm. Certain conditions can cause a difference in blood pressure between your right and left arms. If you have a high blood pressure reading during one visit or you have normal blood pressure with other risk factors, you may be asked to: Return on a different day to have your blood pressure checked again. Monitor your blood pressure at home for 1 week or longer. If you are diagnosed with hypertension, you may have other blood or imaging tests to help your health care provider understand your overall risk for other conditions. How is this treated? This condition is treated by making healthy lifestyle changes, such as eating healthy foods, exercising more, and reducing your alcohol intake. You may be referred for counseling on a healthy diet and physical activity. Your health care provider may prescribe medicine if lifestyle changes are not enough to get your blood pressure under control and if: Your systolic blood pressure is above 130. Your diastolic blood pressure is above 80. Your personal target blood pressure may vary depending on your medical conditions, your age, and other factors. Follow these instructions at home: Eating and drinking  Eat a diet that is high in fiber and potassium, and low in sodium, added sugar, and fat. An example of this eating plan is called the DASH diet. DASH stands for Dietary Approaches to Stop Hypertension. To eat this way: Eat   plenty of fresh fruits and vegetables. Try to fill one half of your plate at each meal with fruits and vegetables. Eat whole grains, such as whole-wheat pasta, brown rice, or whole-grain bread. Fill about one  fourth of your plate with whole grains. Eat or drink low-fat dairy products, such as skim milk or low-fat yogurt. Avoid fatty cuts of meat, processed or cured meats, and poultry with skin. Fill about one fourth of your plate with lean proteins, such as fish, chicken without skin, beans, eggs, or tofu. Avoid pre-made and processed foods. These tend to be higher in sodium, added sugar, and fat. Reduce your daily sodium intake. Many people with hypertension should eat less than 1,500 mg of sodium a day. Do not drink alcohol if: Your health care provider tells you not to drink. You are pregnant, may be pregnant, or are planning to become pregnant. If you drink alcohol: Limit how much you have to: 0-1 drink a day for women. 0-2 drinks a day for men. Know how much alcohol is in your drink. In the [place] one drink equals one 12 oz bottle of beer (355 mL), one 5 oz glass of wine (148 mL), or one 1 oz glass of hard liquor (44 mL). Lifestyle  Work with your health care provider to maintain a healthy body weight or to lose weight. Ask what an ideal weight is for you. Get at least 30 minutes of exercise that causes your heart to beat faster (aerobic exercise) most days of the week. Activities may include walking, swimming, or biking. Include exercise to strengthen your muscles (resistance exercise), such as Pilates or lifting weights, as part of your weekly exercise routine. Try to do these types of exercises for 30 minutes at least 3 days a week. Do not use any products that contain nicotine or tobacco. These products include cigarettes, chewing tobacco, and vaping devices, such as e-cigarettes. If you need help quitting, ask your health care provider. Monitor your blood pressure at home as told by your health care provider. Keep all follow-up visits. This is important. Medicines Take over-the-counter and prescription medicines only as told by your health care provider. Follow directions carefully. Blood  pressure medicines must be taken as prescribed. Do not skip doses of blood pressure medicine. Doing this puts you at risk for problems and can make the medicine less effective. Ask your health care provider about side effects or reactions to medicines that you should watch for. Contact a health care provider if you: Think you are having a reaction to a medicine you are taking. Have headaches that keep coming back (recurring). Feel dizzy. Have swelling in your ankles. Have trouble with your vision. Get help right away if you: Develop a severe headache or confusion. Have unusual weakness or numbness. Feel faint. Have severe pain in your chest or abdomen. Vomit repeatedly. Have trouble breathing. These symptoms may be an emergency. Get help right away. Call 911. Do not wait to see if the symptoms will go away. Do not drive yourself to the hospital. Summary Hypertension is when the force of blood pumping through your arteries is too strong. If this condition is not controlled, it may put you at risk for serious complications. Your personal target blood pressure may vary depending on your medical conditions, your age, and other factors. For most people, a normal blood pressure is less than 120/80. Hypertension is treated with lifestyle changes, medicines, or a combination of both. Lifestyle changes include losing weight, eating a healthy,   low-sodium diet, exercising more, and limiting alcohol. This information is not intended to replace advice given to you by your health care provider. Make sure you discuss any questions you have with your health care provider. Document Revised: 01/10/2021 Document Reviewed: 01/10/2021 Elsevier Patient Education  2023 Elsevier Inc.  

## 2021-10-31 LAB — LIPID PANEL
Chol/HDL Ratio: 2.3 ratio (ref 0.0–4.4)
Cholesterol, Total: 155 mg/dL (ref 100–199)
HDL: 66 mg/dL (ref >39)
LDL Chol Calc (NIH): 72 mg/dL (ref 0–99)
Triglycerides: 95 mg/dL (ref 0–149)
VLDL Cholesterol Cal: 17 mg/dL (ref 5–40)

## 2021-10-31 LAB — HEMOGLOBIN A1C
Est. average glucose Bld gHb Est-mCnc: 177 mg/dL
Hgb A1c MFr Bld: 7.8 % — ABNORMAL HIGH (ref 4.8–5.6)

## 2021-11-06 ENCOUNTER — Ambulatory Visit: Payer: Medicare Other | Admitting: Family

## 2021-11-07 ENCOUNTER — Telehealth: Payer: Self-pay | Admitting: Family

## 2021-11-07 NOTE — Telephone Encounter (Signed)
Copied from [place] [id number]. Topic: General - Inquiry >> Nov 07, 2021  3:17 PM Erskine Squibb wrote: Reason for CRM: Patient called in stating she would like the results of her A1C she had done on August 14th when she saw the provider. Please assist patient further.

## 2021-11-07 NOTE — Telephone Encounter (Signed)
Pt contacted about labs

## 2021-11-08 DIAGNOSIS — N952 Postmenopausal atrophic vaginitis: Secondary | ICD-10-CM | POA: Diagnosis not present

## 2021-11-08 DIAGNOSIS — Z4689 Encounter for fitting and adjustment of other specified devices: Secondary | ICD-10-CM | POA: Diagnosis not present

## 2021-11-23 ENCOUNTER — Other Ambulatory Visit: Payer: Self-pay | Admitting: Family

## 2021-11-23 DIAGNOSIS — Z1231 Encounter for screening mammogram for malignant neoplasm of breast: Secondary | ICD-10-CM

## 2021-11-24 ENCOUNTER — Other Ambulatory Visit: Payer: Self-pay | Admitting: Family

## 2021-11-24 NOTE — Telephone Encounter (Signed)
Medication Refill - Medication:  Disp Refills Start End   glucose blood (PRODIGY NO CODING BLOOD GLUC) test strip         Has the patient contacted their pharmacy? No. (Agent: If no, request that the patient contact the pharmacy for the refill. If patient does not wish to contact the pharmacy document the reason why and proceed with request.) (Agent: If yes, when and what did the pharmacy advise?)  Preferred Pharmacy (with phone number or street name):  [id number] - [place], [place]. Phone:  [phone]  Fax:  [phone]     Has the patient been seen for an appointment in the last year OR does the patient have an upcoming appointment? Yes.    Agent: Please be advised that RX refills may take up to 3 business days. We ask that you follow-up with your pharmacy.

## 2021-11-27 ENCOUNTER — Other Ambulatory Visit: Payer: Self-pay | Admitting: Family

## 2021-11-27 MED ORDER — PRODIGY NO CODING BLOOD GLUC VI STRP: 1.0000 | ORAL_STRIP | 11 refills | Status: DC | PRN

## 2021-11-27 NOTE — Telephone Encounter (Signed)
Requested medication (s) are due for refill today: Yes  Requested medication (s) are on the active medication list:Yes  Last refill:  05/10/21  Future visit scheduled: No  Notes to clinic:  Unable to refill per protocol, last refill by another provider.      Requested Prescriptions  Pending Prescriptions Disp Refills   glucose blood (PRODIGY NO CODING BLOOD GLUC) test strip 100 each     Sig: 1 each by Other route as needed for other. Use as instructed     Endocrinology: Diabetes - Testing Supplies Passed - 11/24/2021 10:54 AM      Passed - Valid encounter within last 12 months    Recent Outpatient Visits           4 weeks ago Essential (primary) hypertension   Primary Care at [facility], Amy J, NP   6 months ago Essential (primary) hypertension   Primary Care at [facility], Amy J, NP   1 year ago Essential hypertension   Primary Care at [facility], Amy J, NP   1 year ago Controlled type 2 diabetes mellitus with stage 4 chronic kidney disease, without long-term current use of insulin [facility])   Primary Care at [facility], Bayard Beaver, MD   1 year ago Controlled type 2 diabetes mellitus without complication, without long-term current use of insulin [facility])   [place], RPH-CPP

## 2021-12-04 DIAGNOSIS — E1351 Other specified diabetes mellitus with diabetic peripheral angiopathy without gangrene: Secondary | ICD-10-CM | POA: Diagnosis not present

## 2021-12-04 DIAGNOSIS — M21611 Bunion of right foot: Secondary | ICD-10-CM | POA: Diagnosis not present

## 2021-12-04 DIAGNOSIS — N184 Chronic kidney disease, stage 4 (severe): Secondary | ICD-10-CM | POA: Diagnosis not present

## 2021-12-04 DIAGNOSIS — I70203 Unspecified atherosclerosis of native arteries of extremities, bilateral legs: Secondary | ICD-10-CM | POA: Diagnosis not present

## 2021-12-04 DIAGNOSIS — M21612 Bunion of left foot: Secondary | ICD-10-CM | POA: Diagnosis not present

## 2021-12-11 DIAGNOSIS — R609 Edema, unspecified: Secondary | ICD-10-CM | POA: Diagnosis not present

## 2021-12-11 DIAGNOSIS — N184 Chronic kidney disease, stage 4 (severe): Secondary | ICD-10-CM | POA: Diagnosis not present

## 2021-12-11 DIAGNOSIS — E559 Vitamin D deficiency, unspecified: Secondary | ICD-10-CM | POA: Diagnosis not present

## 2021-12-11 DIAGNOSIS — D631 Anemia in chronic kidney disease: Secondary | ICD-10-CM | POA: Diagnosis not present

## 2021-12-11 DIAGNOSIS — I129 Hypertensive chronic kidney disease with stage 1 through stage 4 chronic kidney disease, or unspecified chronic kidney disease: Secondary | ICD-10-CM | POA: Diagnosis not present

## 2021-12-29 ENCOUNTER — Other Ambulatory Visit: Payer: Self-pay | Admitting: Family

## 2021-12-29 DIAGNOSIS — I10 Essential (primary) hypertension: Secondary | ICD-10-CM

## 2021-12-29 NOTE — Telephone Encounter (Signed)
Requested medications are due for refill today.  no  Requested medications are on the active medications list.  yes  Last refill. 10/30/2021 #60 3 rf  Future visit scheduled.   no  Notes to clinic.  Labs are expired. Refill request too soon.     Requested Prescriptions  Pending Prescriptions Disp Refills   carvedilol (COREG) 3.125 MG tablet [Pharmacy Med Name: CARVEDILOL 3.125 MG TABLET] 180 tablet 1    Sig: TAKE 1 TABLET BY MOUTH TWICE A DAY WITH FOOD     Cardiovascular: Beta Blockers 3 Failed - 12/29/2021 10:04 AM      Failed - Cr in normal range and within 360 days    Creatinine, Ser  Date Value Ref Range Status  05/10/2021 2.03 (H) 0.57 - 1.00 mg/dL Final         Failed - AST in normal range and within 360 days    AST  Date Value Ref Range Status  11/11/2018 19 0 - 40 IU/L Final         Failed - ALT in normal range and within 360 days    ALT  Date Value Ref Range Status  11/11/2018 18 0 - 32 IU/L Final         Failed - Last BP in normal range    BP Readings from Last 1 Encounters:  10/30/21 (!) 148/64         Passed - Last Heart Rate in normal range    Pulse Readings from Last 1 Encounters:  10/30/21 70         Passed - Valid encounter within last 6 months    Recent Outpatient Visits           2 months ago Essential (primary) hypertension   Primary Care at [facility], Amy J, NP   7 months ago Essential (primary) hypertension   Primary Care at [facility], Amy J, NP   1 year ago Essential hypertension   Primary Care at [facility], Amy J, NP   1 year ago Controlled type 2 diabetes mellitus with stage 4 chronic kidney disease, without long-term current use of insulin [facility])   Primary Care at [facility], Bayard Beaver, MD   1 year ago Controlled type 2 diabetes mellitus without complication, without long-term current use of insulin [facility])   [place],  RPH-CPP

## 2022-01-17 DIAGNOSIS — E785 Hyperlipidemia, unspecified: Secondary | ICD-10-CM | POA: Diagnosis not present

## 2022-01-17 DIAGNOSIS — E119 Type 2 diabetes mellitus without complications: Secondary | ICD-10-CM | POA: Diagnosis not present

## 2022-01-17 DIAGNOSIS — E0590 Thyrotoxicosis, unspecified without thyrotoxic crisis or storm: Secondary | ICD-10-CM | POA: Diagnosis not present

## 2022-01-17 DIAGNOSIS — I10 Essential (primary) hypertension: Secondary | ICD-10-CM | POA: Diagnosis not present

## 2022-01-31 DIAGNOSIS — Z4689 Encounter for fitting and adjustment of other specified devices: Secondary | ICD-10-CM | POA: Diagnosis not present

## 2022-01-31 DIAGNOSIS — N8110 Cystocele, unspecified: Secondary | ICD-10-CM | POA: Diagnosis not present

## 2022-01-31 DIAGNOSIS — N816 Rectocele: Secondary | ICD-10-CM | POA: Diagnosis not present

## 2022-02-05 DIAGNOSIS — E0590 Thyrotoxicosis, unspecified without thyrotoxic crisis or storm: Secondary | ICD-10-CM | POA: Diagnosis not present

## 2022-02-05 DIAGNOSIS — I10 Essential (primary) hypertension: Secondary | ICD-10-CM | POA: Diagnosis not present

## 2022-02-05 DIAGNOSIS — N182 Chronic kidney disease, stage 2 (mild): Secondary | ICD-10-CM | POA: Diagnosis not present

## 2022-02-05 DIAGNOSIS — E1165 Type 2 diabetes mellitus with hyperglycemia: Secondary | ICD-10-CM | POA: Diagnosis not present

## 2022-02-05 DIAGNOSIS — E7800 Pure hypercholesterolemia, unspecified: Secondary | ICD-10-CM | POA: Diagnosis not present

## 2022-02-13 ENCOUNTER — Other Ambulatory Visit: Payer: Self-pay | Admitting: Family

## 2022-02-13 DIAGNOSIS — I10 Essential (primary) hypertension: Secondary | ICD-10-CM

## 2022-02-13 NOTE — Telephone Encounter (Signed)
Call patient with update. During visit 10/30/2021 confirmed patient established with Carolyn Forward, MD at cardiology and follow-up appointment scheduled November 2023. Does patient have any updates? Order complete.

## 2022-02-26 DIAGNOSIS — Z4689 Encounter for fitting and adjustment of other specified devices: Secondary | ICD-10-CM | POA: Diagnosis not present

## 2022-02-27 ENCOUNTER — Telehealth: Payer: Self-pay | Admitting: Family

## 2022-02-27 NOTE — Telephone Encounter (Signed)
Left message for patient to call back and schedule Medicare Annual Wellness Visit (AWV) either virtually or phone  Left  my jabber number [phone]   Last AWV 12/10/20 please schedule with Nurse Health Adviser   45 min for awv-i and in office appointments 30 min for awv-s  phone/virtual appointments  

## 2022-03-01 ENCOUNTER — Other Ambulatory Visit: Payer: Self-pay | Admitting: Family

## 2022-03-01 DIAGNOSIS — I10 Essential (primary) hypertension: Secondary | ICD-10-CM

## 2022-03-14 DIAGNOSIS — M21612 Bunion of left foot: Secondary | ICD-10-CM | POA: Diagnosis not present

## 2022-03-14 DIAGNOSIS — L84 Corns and callosities: Secondary | ICD-10-CM | POA: Diagnosis not present

## 2022-03-14 DIAGNOSIS — E1351 Other specified diabetes mellitus with diabetic peripheral angiopathy without gangrene: Secondary | ICD-10-CM | POA: Diagnosis not present

## 2022-03-14 DIAGNOSIS — L603 Nail dystrophy: Secondary | ICD-10-CM | POA: Diagnosis not present

## 2022-03-14 DIAGNOSIS — B351 Tinea unguium: Secondary | ICD-10-CM | POA: Diagnosis not present

## 2022-03-14 DIAGNOSIS — M21611 Bunion of right foot: Secondary | ICD-10-CM | POA: Diagnosis not present

## 2022-03-15 ENCOUNTER — Ambulatory Visit (INDEPENDENT_AMBULATORY_CARE_PROVIDER_SITE_OTHER): Payer: Medicare Other

## 2022-03-15 VITALS — Ht 62.0 in | Wt 140.0 lb

## 2022-03-15 DIAGNOSIS — Z0000 Encounter for general adult medical examination without abnormal findings: Secondary | ICD-10-CM | POA: Diagnosis not present

## 2022-03-15 NOTE — Progress Notes (Signed)
I connected with Carolyn Bowen today by telephone and verified that I am speaking with the correct person using two identifiers. Location patient: home Location provider: work Persons participating in the virtual visit: Carolyn Bowen, Glenna Durand LPN.   I discussed the limitations, risks, security and privacy concerns of performing an evaluation and management service by telephone and the availability of in person appointments. I also discussed with the patient that there may be a patient responsible charge related to this service. The patient expressed understanding and verbally consented to this telephonic visit.    Interactive audio and video telecommunications were attempted between this provider and patient, however failed, due to patient having technical difficulties OR patient did not have access to video capability.  We continued and completed visit with audio only.     Vital signs may be patient reported or missing.  Subjective:   Carolyn Bowen is a 88 y.o. female who presents for Medicare Annual (Subsequent) preventive examination.  Review of Systems     Cardiac Risk Factors include: advanced age (>20men, >84 women);diabetes mellitus;hypertension     Objective:    Today's Vitals   03/15/22 1356  Weight: 140 lb (63.5 kg)  Height: 5\' 2"  (1.575 m)   Body mass index is 25.61 kg/m.     03/15/2022    2:00 PM 08/08/2015   10:44 AM  Advanced Directives  Does Patient Have a Medical Advance Directive? Yes Yes  Type of Paramedic of [place];Living will   Copy of [place] in Chart? No - copy requested     Current Medications (verified) Outpatient Encounter Medications as of 03/15/2022  Medication Sig   amLODipine (NORVASC) 10 MG tablet TAKE 1 TABLET BY MOUTH EVERY DAY   aspirin 81 MG chewable tablet Chew 81 mg by mouth daily.   atorvastatin (LIPITOR) 20 MG tablet Take 1 tablet (20 mg total) by mouth daily.   carvedilol  (COREG) 3.125 MG tablet TAKE 1 TABLET BY MOUTH TWICE A DAY WITH FOOD   Cholecalciferol (VITAMIN D3) 50 MCG (2000 UT) capsule 1 capsule   [employer] D3 50 MCG (2000 UT) CAPS Take 1 capsule by mouth daily.   esomeprazole (NEXIUM) 20 MG capsule Take 20 mg by mouth daily as needed (heartburn).   estradiol (ESTRACE) 0.1 MG/GM vaginal cream Place 1 g vaginally 2 (two) times a week.   fexofenadine (ALLEGRA) 180 MG tablet Take 180 mg by mouth daily.   furosemide (LASIX) 40 MG tablet Take 1 tablet by mouth daily as needed.   glucose blood (PRODIGY NO CODING BLOOD GLUC) test strip 1 each by Other route as needed for other. Use as instructed   losartan (COZAAR) 100 MG tablet TAKE 1 TABLET BY MOUTH EVERY DAY   methimazole (TAPAZOLE) 5 MG tablet Take by mouth.   pseudoephedrine-guaifenesin (MUCINEX D) 60-600 MG 12 hr tablet Take 1 tablet by mouth every 12 (twelve) hours.   No facility-administered encounter medications on file as of 03/15/2022.    Allergies (verified) Codeine, Penicillin g benzathine, Penicillins, and Amlodipine besy-benazepril hcl   History: Past Medical History:  Diagnosis Date   Abdominal pain    Diabetes mellitus without complication ([place])    Hypertension    Thyroid disease    Past Surgical History:  Procedure Laterality Date   ABDOMINAL HYSTERECTOMY     APPENDECTOMY     BREAST BIOPSY Right 2009   BREAST EXCISIONAL BIOPSY Right 2002   CATARACT EXTRACTION  2019   HERNIA  REPAIR     Family History  Problem Relation Age of Onset   Heart disease Mother    Cancer Father    Heart disease Brother    Breast cancer Daughter    Social History   Socioeconomic History   Marital status: Widowed    Spouse name: Not on file   Number of children: Not on file   Years of education: Not on file   Highest education level: Not on file  Occupational History   Not on file  Tobacco Use   Smoking status: Never    Passive exposure: Never   Smokeless tobacco: Never  Vaping Use   Vaping  Use: Never used  Substance and Sexual Activity   Alcohol use: No    Alcohol/week: 0.0 standard drinks of alcohol   Drug use: No   Sexual activity: Not Currently  Other Topics Concern   Not on file  Social History Narrative   Not on file   Social Determinants of Health   Financial Resource Strain: Low Risk  (03/15/2022)   Overall Financial Resource Strain (CARDIA)    Difficulty of Paying Living Expenses: Not hard at all  Food Insecurity: No Food Insecurity (03/15/2022)   Hunger Vital Sign    Worried About Running Out of Food in the Last Year: Never true    [place] in the Last Year: Never true  Transportation Needs: No Transportation Needs (03/15/2022)   PRAPARE - Hydrologist (Medical): No    Lack of Transportation (Non-Medical): No  Physical Activity: Inactive (03/15/2022)   Exercise Vital Sign    Days of Exercise per Week: 0 days    Minutes of Exercise per Session: 0 min  Stress: No Stress Concern Present (03/15/2022)   [place]    Feeling of Stress : Not at all  Social Connections: Moderately Isolated (12/10/2020)   Social Connection and Isolation Panel [NHANES]    Frequency of Communication with Friends and Family: More than three times a week    Frequency of Social Gatherings with Friends and Family: Once a week    Attends Religious Services: 1 to 4 times per year    Active Member of [employer] or Organizations: No    Attends Archivist Meetings: Never    Marital Status: Widowed    Tobacco Counseling Counseling given: Not Answered   Clinical Intake:  Pre-visit preparation completed: Yes  Pain : No/denies pain     Nutritional Status: BMI 25 -29 Overweight Nutritional Risks: None Diabetes: Yes  How often do you need to have someone help you when you read instructions, pamphlets, or other written materials from your doctor or pharmacy?: 1 -  Never  Diabetic? Yes Nutrition Risk Assessment:  Has the patient had any N/V/D within the last 2 months?  No  Does the patient have any non-healing wounds?  No  Has the patient had any unintentional weight loss or weight gain?  No   Diabetes:  Is the patient diabetic?  Yes  If diabetic, was a CBG obtained today?  No  Did the patient bring in their glucometer from home?  No  How often do you monitor your CBG's? daily.   Financial Strains and Diabetes Management:  Are you having any financial strains with the device, your supplies or your medication? No .  Does the patient want to be seen by Chronic Care Management for management of their diabetes?  No  Would the patient like to be referred to a Nutritionist or for Diabetic Management?  No   Diabetic Exams:  Diabetic Eye Exam: Overdue for diabetic eye exam. Pt has been advised about the importance in completing this exam. Patient advised to call and schedule an eye exam. Diabetic Foot Exam: Overdue, Pt has been advised about the importance in completing this exam. Pt is scheduled for diabetic foot exam on next appointment.   Interpreter Needed?: No  Information entered by :: NAllen LPN   Activities of Daily Living    03/15/2022    2:01 PM  In your present state of health, do you have any difficulty performing the following activities:  Hearing? 0  Vision? 0  Difficulty concentrating or making decisions? 0  Walking or climbing stairs? 0  Dressing or bathing? 0  Doing errands, shopping? 0  Preparing Food and eating ? N  Using the Toilet? N  In the past six months, have you accidently leaked urine? N  Do you have problems with loss of bowel control? N  Managing your Medications? N  Managing your Finances? N  Housekeeping or managing your Housekeeping? N    Patient Care Team: Camillia Herter, NP as PCP - General (Nurse Practitioner)  Indicate any recent Medical Services you may have received from other than [facility]  providers in the past year (date may be approximate).     Assessment:   This is a routine wellness examination for Carolyn Bowen.  Hearing/Vision screen Vision Screening - Comments:: Regular eye exams, Dr. Clifton James  Dietary issues and exercise activities discussed: Current Exercise Habits: The patient does not participate in regular exercise at present   Goals Addressed             This Visit's Progress    Patient Stated       03/15/2022, maintain current health       Depression Screen    03/15/2022    2:01 PM 10/30/2021    8:50 AM 05/10/2021    9:13 AM 12/10/2020   10:09 AM 11/09/2020   10:35 AM 05/30/2020    9:54 AM 02/29/2020   10:16 AM  PHQ 2/9 Scores  PHQ - 2 Score 0 0 0 0 0 0 0  PHQ- 9 Score      0     Fall Risk    03/15/2022    2:00 PM 10/30/2021    8:50 AM 05/30/2020    9:55 AM 02/29/2020   10:15 AM 11/11/2018   11:24 AM  Fall Risk   Falls in the past year? 0 0 0 0 0  Number falls in past yr: 0 0 0 0 0  Injury with Fall? 0 0 0 0 0  Risk for fall due to : Medication side effect No Fall Risks No Fall Risks No Fall Risks   Follow up Falls prevention discussed;Education provided;Falls evaluation completed Falls evaluation completed Falls evaluation completed Falls evaluation completed     FALL RISK PREVENTION PERTAINING TO THE HOME:  Any stairs in or around the home? Yes  If so, are there any without handrails? No  Home free of loose throw rugs in walkways, pet beds, electrical cords, etc? Yes  Adequate lighting in your home to reduce risk of falls? Yes   ASSISTIVE DEVICES UTILIZED TO PREVENT FALLS:  Life alert? No  Use of a cane, walker or w/c? Yes  Grab bars in the bathroom? Yes  Shower chair or bench in shower? Yes  Elevated toilet seat or a handicapped toilet? Yes   TIMED UP AND GO:  Was the test performed? No .      Cognitive Function:        03/15/2022    2:02 PM  6CIT Screen  What Year? 0 points  What month? 0 points  What time? 0  points  Count back from 20 0 points  Months in reverse 2 points  Repeat phrase 0 points  Total Score 2 points    Immunizations Immunization History  Administered Date(s) Administered   Fluad Quad(high Dose 65+) 12/04/2021   Influenza,inj,Quad PF,6+ Mos 11/11/2018   Influenza-Unspecified 12/18/2019   PFIZER(Purple Top)SARS-COV-2 Vaccination 04/09/2019, 04/30/2019, 12/14/2019   Pfizer Covid-19 Vaccine Bivalent Booster 72yrs & up 12/04/2021   RSV,unspecified 01/17/2022   Tdap 01/02/2021    TDAP status: Up to date  Flu Vaccine status: Up to date  Pneumococcal vaccine status: Due, Education has been provided regarding the importance of this vaccine. Advised may receive this vaccine at local pharmacy or Health Dept. Aware to provide a copy of the vaccination record if obtained from local pharmacy or Health Dept. Verbalized acceptance and understanding.  Covid-19 vaccine status: Completed vaccines  Qualifies for Shingles Vaccine? Yes   Zostavax completed No   Shingrix Completed?: No.    Education has been provided regarding the importance of this vaccine. Patient has been advised to call insurance company to determine out of pocket expense if they have not yet received this vaccine. Advised may also receive vaccine at local pharmacy or Health Dept. Verbalized acceptance and understanding.  Screening Tests Health Maintenance  Topic Date Due   OPHTHALMOLOGY EXAM  Never done   Zoster Vaccines- Shingrix (1 of 2) Never done   Pneumonia Vaccine 26+ Years old (1 - PCV) Never done   FOOT EXAM  08/26/2020   Medicare Annual Wellness (AWV)  12/10/2021   COVID-19 Vaccine (5 - 2023-24 season) 01/29/2022   HEMOGLOBIN A1C  05/02/2022   DTaP/Tdap/Td (2 - Td or Tdap) 01/03/2031   INFLUENZA VACCINE  Completed   DEXA SCAN  Completed   HPV VACCINES  Aged Out    Health Maintenance  Health Maintenance Due  Topic Date Due   OPHTHALMOLOGY EXAM  Never done   Zoster Vaccines- Shingrix (1 of 2)  Never done   Pneumonia Vaccine 32+ Years old (1 - PCV) Never done   FOOT EXAM  08/26/2020   Medicare Annual Wellness (AWV)  12/10/2021   COVID-19 Vaccine (5 - 2023-24 season) 01/29/2022    Colorectal cancer screening: No longer required.   Mammogram status: scheduled for 03/28/2022  Bone Density status: scheduled for 03/28/2022  Lung Cancer Screening: (Low Dose CT Chest recommended if Age 46-80 years, 30 pack-year currently smoking OR have quit w/in 15years.) does not qualify.   Lung Cancer Screening Referral: no  Additional Screening:  Hepatitis C Screening: does not qualify;   Vision Screening: Recommended annual ophthalmology exams for early detection of glaucoma and other disorders of the eye. Is the patient up to date with their annual eye exam?  Yes  Who is the provider or what is the name of the office in which the patient attends annual eye exams? Dr. Clifton James If pt is not established with a provider, would they like to be referred to a provider to establish care? No .   Dental Screening: Recommended annual dental exams for proper oral hygiene  Community Resource Referral / Chronic Care Management: CRR required this visit?  No  CCM required this visit?  No      Plan:     I have personally reviewed and noted the following in the patient's chart:   Medical and social history Use of alcohol, tobacco or illicit drugs  Current medications and supplements including opioid prescriptions. Patient is not currently taking opioid prescriptions. Functional ability and status Nutritional status Physical activity Advanced directives List of other physicians Hospitalizations, surgeries, and ER visits in previous 12 months Vitals Screenings to include cognitive, depression, and falls Referrals and appointments  In addition, I have reviewed and discussed with patient certain preventive protocols, quality metrics, and best practice recommendations. A written personalized  care plan for preventive services as well as general preventive health recommendations were provided to patient.     Kellie Simmering, LPN   [id number]   Nurse Notes: none  Due to this being a virtual visit, the after visit summary with patients personalized plan was offered to patient via mail or my-chart.  to pick up at office at next visit

## 2022-03-15 NOTE — Patient Instructions (Signed)
Carolyn Bowen , Thank you for taking time to come for your Medicare Wellness Visit. I appreciate your ongoing commitment to your health goals. Please review the following plan we discussed and let me know if I can assist you in the future.   These are the goals we discussed:  Goals      Patient Stated     03/15/2022, maintain current health        This is a list of the screening recommended for you and due dates:  Health Maintenance  Topic Date Due   Eye exam for diabetics  Never done   Zoster (Shingles) Vaccine (1 of 2) Never done   Pneumonia Vaccine (1 - PCV) Never done   Complete foot exam   08/26/2020   COVID-19 Vaccine (5 - 2023-24 season) 01/29/2022   Hemoglobin A1C  05/02/2022   Medicare Annual Wellness Visit  03/16/2023   DTaP/Tdap/Td vaccine (2 - Td or Tdap) 01/03/2031   Flu Shot  Completed   DEXA scan (bone density measurement)  Completed   HPV Vaccine  Aged Out    Advanced directives: Please bring a copy of your POA (Power of [place]) and/or Living Will to your next appointment.   Conditions/risks identified: none  Next appointment: Follow up in one year for your annual wellness visit    Preventive Care 65 Years and Older, Female Preventive care refers to lifestyle choices and visits with your health care provider that can promote health and wellness. What does preventive care include? A yearly physical exam. This is also called an annual well check. Dental exams once or twice a year. Routine eye exams. Ask your health care provider how often you should have your eyes checked. Personal lifestyle choices, including: Daily care of your teeth and gums. Regular physical activity. Eating a healthy diet. Avoiding tobacco and drug use. Limiting alcohol use. Practicing safe sex. Taking low-dose aspirin every day. Taking vitamin and mineral supplements as recommended by your health care provider. What happens during an annual well check? The services and screenings  done by your health care provider during your annual well check will depend on your age, overall health, lifestyle risk factors, and family history of disease. Counseling  Your health care provider may ask you questions about your: Alcohol use. Tobacco use. Drug use. Emotional well-being. Home and relationship well-being. Sexual activity. Eating habits. History of falls. Memory and ability to understand (cognition). Work and work Statistician. Reproductive health. Screening  You may have the following tests or measurements: Height, weight, and BMI. Blood pressure. Lipid and cholesterol levels. These may be checked every 5 years, or more frequently if you are over 86 years old. Skin check. Lung cancer screening. You may have this screening every year starting at age 86 if you have a 30-pack-year history of smoking and currently smoke or have quit within the past 15 years. Fecal occult blood test (FOBT) of the stool. You may have this test every year starting at age 86. Flexible sigmoidoscopy or colonoscopy. You may have a sigmoidoscopy every 5 years or a colonoscopy every 10 years starting at age 86. Hepatitis C blood test. Hepatitis B blood test. Sexually transmitted disease (STD) testing. Diabetes screening. This is done by checking your blood sugar (glucose) after you have not eaten for a while (fasting). You may have this done every 1-3 years. Bone density scan. This is done to screen for osteoporosis. You may have this done starting at age 86. Mammogram. This may be done  every 1-2 years. Talk to your health care provider about how often you should have regular mammograms. Talk with your health care provider about your test results, treatment options, and if necessary, the need for more tests. Vaccines  Your health care provider may recommend certain vaccines, such as: Influenza vaccine. This is recommended every year. Tetanus, diphtheria, and acellular pertussis (Tdap, Td)  vaccine. You may need a Td booster every 10 years. Zoster vaccine. You may need this after age 86. Pneumococcal 13-valent conjugate (PCV13) vaccine. One dose is recommended after age 86. Pneumococcal polysaccharide (PPSV23) vaccine. One dose is recommended after age 86. Talk to your health care provider about which screenings and vaccines you need and how often you need them. This information is not intended to replace advice given to you by your health care provider. Make sure you discuss any questions you have with your health care provider. Document Released: 04/01/2015 Document Revised: 11/23/2015 Document Reviewed: 01/04/2015 Elsevier Interactive Patient Education  2017 [employer] Prevention in the Home Falls can cause injuries. They can happen to people of all ages. There are many things you can do to make your home safe and to help prevent falls. What can I do on the outside of my home? Regularly fix the edges of walkways and driveways and fix any cracks. Remove anything that might make you trip as you walk through a door, such as a raised step or threshold. Trim any bushes or trees on the path to your home. Use bright outdoor lighting. Clear any walking paths of anything that might make someone trip, such as rocks or tools. Regularly check to see if handrails are loose or broken. Make sure that both sides of any steps have handrails. Any raised decks and porches should have guardrails on the edges. Have any leaves, snow, or ice cleared regularly. Use sand or salt on walking paths during winter. Clean up any spills in your garage right away. This includes oil or grease spills. What can I do in the bathroom? Use night lights. Install grab bars by the toilet and in the tub and shower. Do not use towel bars as grab bars. Use non-skid mats or decals in the tub or shower. If you need to sit down in the shower, use a plastic, non-slip stool. Keep the floor dry. Clean up any  water that spills on the floor as soon as it happens. Remove soap buildup in the tub or shower regularly. Attach bath mats securely with double-sided non-slip rug tape. Do not have throw rugs and other things on the floor that can make you trip. What can I do in the bedroom? Use night lights. Make sure that you have a light by your bed that is easy to reach. Do not use any sheets or blankets that are too big for your bed. They should not hang down onto the floor. Have a firm chair that has side arms. You can use this for support while you get dressed. Do not have throw rugs and other things on the floor that can make you trip. What can I do in the kitchen? Clean up any spills right away. Avoid walking on wet floors. Keep items that you use a lot in easy-to-reach places. If you need to reach something above you, use a strong step stool that has a grab bar. Keep electrical cords out of the way. Do not use floor polish or wax that makes floors slippery. If you must use wax, use  non-skid floor wax. Do not have throw rugs and other things on the floor that can make you trip. What can I do with my stairs? Do not leave any items on the stairs. Make sure that there are handrails on both sides of the stairs and use them. Fix handrails that are broken or loose. Make sure that handrails are as long as the stairways. Check any carpeting to make sure that it is firmly attached to the stairs. Fix any carpet that is loose or worn. Avoid having throw rugs at the top or bottom of the stairs. If you do have throw rugs, attach them to the floor with carpet tape. Make sure that you have a light switch at the top of the stairs and the bottom of the stairs. If you do not have them, ask someone to add them for you. What else can I do to help prevent falls? Wear shoes that: Do not have high heels. Have rubber bottoms. Are comfortable and fit you well. Are closed at the toe. Do not wear sandals. If you use a  stepladder: Make sure that it is fully opened. Do not climb a closed stepladder. Make sure that both sides of the stepladder are locked into place. Ask someone to hold it for you, if possible. Clearly mark and make sure that you can see: Any grab bars or handrails. First and last steps. Where the edge of each step is. Use tools that help you move around (mobility aids) if they are needed. These include: Canes. Walkers. Scooters. Crutches. Turn on the lights when you go into a dark area. Replace any light bulbs as soon as they burn out. Set up your furniture so you have a clear path. Avoid moving your furniture around. If any of your floors are uneven, fix them. If there are any pets around you, be aware of where they are. Review your medicines with your doctor. Some medicines can make you feel dizzy. This can increase your chance of falling. Ask your doctor what other things that you can do to help prevent falls. This information is not intended to replace advice given to you by your health care provider. Make sure you discuss any questions you have with your health care provider. Document Released: 12/30/2008 Document Revised: 08/11/2015 Document Reviewed: 04/09/2014 Elsevier Interactive Patient Education  2017 [employer].

## 2022-03-28 ENCOUNTER — Other Ambulatory Visit: Payer: Medicare Other

## 2022-03-28 ENCOUNTER — Ambulatory Visit: Payer: Medicare Other

## 2022-04-11 DIAGNOSIS — N898 Other specified noninflammatory disorders of vagina: Secondary | ICD-10-CM | POA: Diagnosis not present

## 2022-04-11 DIAGNOSIS — Z4689 Encounter for fitting and adjustment of other specified devices: Secondary | ICD-10-CM | POA: Diagnosis not present

## 2022-04-25 ENCOUNTER — Telehealth: Payer: Self-pay

## 2022-04-25 DIAGNOSIS — I70203 Unspecified atherosclerosis of native arteries of extremities, bilateral legs: Secondary | ICD-10-CM | POA: Insufficient documentation

## 2022-04-25 NOTE — Telephone Encounter (Signed)
Fax from [facility] for diabetes testing supplies, recvd in office on 02/6, forwarding to provider for completion

## 2022-04-25 NOTE — Telephone Encounter (Signed)
Copied from [place] [id number]. Topic: General - Other >> Apr 24, 2022  1:18 PM Ja-Kwan M wrote: Reason for CRM: Pt stated that the cost of her test strips are high so she will be switching to a diabetic supply company and she wanted the provider to be aware. Pt requests that her call be returned at [phone]

## 2022-04-25 NOTE — Telephone Encounter (Signed)
Copied from [place]. Topic: General - Other >> Apr 25, 2022  3:53 PM Everette C wrote: Reason for CRM: The patient has returned a missed call from E. Jimmye Norman   The patient shares that they test themselves 1 time daily   Please contact further if needed  Form will be faxed w/testing 1x day

## 2022-04-25 NOTE — Telephone Encounter (Signed)
Att to contact pt to verify how many times per day do she test her blood sugars so the form from [facility] can be completed for her diabetic testing supplies

## 2022-04-30 NOTE — Progress Notes (Unsigned)
Patient ID: Carolyn Bowen, female    DOB: 07/27/1927  MRN: [id number]  CC: Follow-Up  Subjective: Carolyn Bowen is a 89 y.o. female who presents for follow-up.   Her concerns today include:  Patient presents for diabetes follow-up. She denies red flag symptoms. She denies any issues/concerns for discussion today.   Patient Active Problem List   Diagnosis Date Noted   Unspecified atherosclerosis of native arteries of extremities, bilateral legs ([place]) 04/25/2022   Diabetic peripheral angiopathy ([place]) 04/03/2021   Hypercholesterolemia 11/09/2020   Hyperglycemia due to type 2 diabetes mellitus ([place]) 11/09/2020   Positive for microalbuminuria [id number]   Lichen planus [id number]   Anemia, chronic disease 12/30/2018   Chronic kidney disease, stage 2 (mild) 12/30/2018   Vaginal prolapse 11/11/2018   Controlled type 2 diabetes mellitus without complication, without long-term current use of insulin ([place]) 11/11/2018   Thyrotoxicosis 11/11/2018   Vaginal bleeding 11/11/2018   Hematuria, gross 11/11/2018   Essential hypertension 09/06/2014     Current Outpatient Medications on File Prior to Visit  Medication Sig Dispense Refill   amLODipine (NORVASC) 10 MG tablet TAKE 1 TABLET BY MOUTH EVERY DAY 90 tablet 1   aspirin 81 MG chewable tablet Chew 81 mg by mouth daily.     atorvastatin (LIPITOR) 20 MG tablet Take 1 tablet (20 mg total) by mouth daily. 90 tablet 1   carvedilol (COREG) 3.125 MG tablet TAKE 1 TABLET BY MOUTH TWICE A DAY WITH FOOD 180 tablet 0   Cholecalciferol (VITAMIN D3) 50 MCG (2000 UT) capsule 1 capsule     CVS D3 50 MCG (2000 UT) CAPS Take 1 capsule by mouth daily.     esomeprazole (NEXIUM) 20 MG capsule Take 20 mg by mouth daily as needed (heartburn).     estradiol (ESTRACE) 0.1 MG/GM vaginal cream Place 1 g vaginally 2 (two) times a week.     fexofenadine (ALLEGRA) 180 MG tablet Take 180 mg by mouth daily.     furosemide (LASIX) 40 MG tablet Take 1 tablet by mouth  daily as needed.     glucose blood (PRODIGY NO CODING BLOOD GLUC) test strip 1 each by Other route as needed for other. Use as instructed 100 each 11   losartan (COZAAR) 100 MG tablet TAKE 1 TABLET BY MOUTH EVERY DAY 30 tablet 2   methimazole (TAPAZOLE) 5 MG tablet Take by mouth.     pseudoephedrine-guaifenesin (MUCINEX D) 60-600 MG 12 hr tablet Take 1 tablet by mouth every 12 (twelve) hours.     No current facility-administered medications on file prior to visit.    Allergies  Allergen Reactions   Codeine Other (See Comments)   Penicillin G Benzathine Other (See Comments)   Penicillins Hives   Amlodipine Besy-Benazepril Hcl Rash    Social History   Socioeconomic History   Marital status: Widowed    Spouse name: Not on file   Number of children: Not on file   Years of education: Not on file   Highest education level: Not on file  Occupational History   Not on file  Tobacco Use   Smoking status: Never    Passive exposure: Never   Smokeless tobacco: Never  Vaping Use   Vaping Use: Never used  Substance and Sexual Activity   Alcohol use: No    Alcohol/week: 0.0 standard drinks of alcohol   Drug use: No   Sexual activity: Not Currently  Other Topics Concern   Not on file  Social History  Narrative   Not on file   Social Determinants of Health   Financial Resource Strain: Low Risk  (03/15/2022)   Overall Financial Resource Strain (CARDIA)    Difficulty of Paying Living Expenses: Not hard at all  Food Insecurity: No Food Insecurity (03/15/2022)   Hunger Vital Sign    Worried About Running Out of Food in the Last Year: Never true    Ran Out of Food in the Last Year: Never true  Transportation Needs: No Transportation Needs (03/15/2022)   PRAPARE - Hydrologist (Medical): No    Lack of Transportation (Non-Medical): No  Physical Activity: Inactive (03/15/2022)   Exercise Vital Sign    Days of Exercise per Week: 0 days    Minutes of Exercise  per Session: 0 min  Stress: No Stress Concern Present (03/15/2022)   [place]    Feeling of Stress : Not at all  Social Connections: Moderately Isolated (12/10/2020)   Social Connection and Isolation Panel [NHANES]    Frequency of Communication with Friends and Family: More than three times a week    Frequency of Social Gatherings with Friends and Family: Once a week    Attends Religious Services: 1 to 4 times per year    Active Member of [employer] or Organizations: No    Attends Archivist Meetings: Never    Marital Status: Widowed  Human resources officer Violence: Not on file    Family History  Problem Relation Age of Onset   Heart disease Mother    Cancer Father    Heart disease Brother    Breast cancer Daughter     Past Surgical History:  Procedure Laterality Date   ABDOMINAL HYSTERECTOMY     APPENDECTOMY     BREAST BIOPSY Right 2009   BREAST EXCISIONAL BIOPSY Right 2002   CATARACT EXTRACTION  2019   HERNIA REPAIR      ROS: Review of Systems Negative except as stated above  PHYSICAL EXAM: BP 132/65 (BP Location: Left Arm, Patient Position: Sitting, Cuff Size: Normal)   Pulse (!) 54   Temp 98.3 F (36.8 C)   Resp 16   Ht 5' 2.64" (1.591 m)   Wt 136 lb (61.7 kg)   SpO2 95%   BMI 24.37 kg/m   Physical Exam HENT:     Head: Normocephalic and atraumatic.  Eyes:     Extraocular Movements: Extraocular movements intact.     Conjunctiva/sclera: Conjunctivae normal.     Pupils: Pupils are equal, round, and reactive to light.  Cardiovascular:     Rate and Rhythm: Bradycardia present.     Pulses: Normal pulses.     Heart sounds: Normal heart sounds.  Pulmonary:     Effort: Pulmonary effort is normal.     Breath sounds: Normal breath sounds.  Musculoskeletal:     Cervical back: Normal range of motion and neck supple.  Neurological:     General: No focal deficit present.     Mental Status: She  is alert and oriented to person, place, and time.  Psychiatric:        Mood and Affect: Mood normal.        Behavior: Behavior normal.     Results for orders placed or performed in visit on 05/02/22  POCT glycosylated hemoglobin (Hb A1C)  Result Value Ref Range   Hemoglobin A1C 7.6 (A) 4.0 - 5.6 %   HbA1c POC (<> result,  manual entry)     HbA1c, POC (prediabetic range)     HbA1c, POC (controlled diabetic range)      ASSESSMENT AND PLAN: 1. Controlled type 2 diabetes mellitus with stage 4 chronic kidney disease, without long-term current use of insulin (HCC) - Hemoglobin A1c at goal today at 7.6%, goal 8%. This is improved from previous 7.8%.  - Continue diet management. Will not start any diabetic agents given good glycemic control for age and recent history of hypoglycemia on medications.  - Follow-up with primary provider in 3 months or sooner if needed.  - POCT glycosylated hemoglobin (Hb A1C)   Patient was given the opportunity to ask questions.  Patient verbalized understanding of the plan and was able to repeat key elements of the plan. Patient was given clear instructions to go to Emergency Department or return to medical center if symptoms don't improve, worsen, or new problems develop.The patient verbalized understanding.   Orders Placed This Encounter  Procedures   POCT glycosylated hemoglobin (Hb A1C)    Return in about 3 months (around 07/31/2022) for Follow-Up or next available chronic care mgmt .  Camillia Herter, NP

## 2022-05-02 ENCOUNTER — Ambulatory Visit (INDEPENDENT_AMBULATORY_CARE_PROVIDER_SITE_OTHER): Payer: Medicare Other | Admitting: Family

## 2022-05-02 VITALS — BP 132/65 | HR 54 | Temp 98.3°F | Resp 16 | Ht 62.64 in | Wt 136.0 lb

## 2022-05-02 DIAGNOSIS — E1122 Type 2 diabetes mellitus with diabetic chronic kidney disease: Secondary | ICD-10-CM | POA: Diagnosis not present

## 2022-05-02 DIAGNOSIS — N184 Chronic kidney disease, stage 4 (severe): Secondary | ICD-10-CM

## 2022-05-02 LAB — POCT GLYCOSYLATED HEMOGLOBIN (HGB A1C): Hemoglobin A1C: 7.6 % — AB (ref 4.0–5.6)

## 2022-05-02 NOTE — Progress Notes (Signed)
Pt presents for follow-up

## 2022-05-07 DIAGNOSIS — N898 Other specified noninflammatory disorders of vagina: Secondary | ICD-10-CM | POA: Diagnosis not present

## 2022-05-07 DIAGNOSIS — N8110 Cystocele, unspecified: Secondary | ICD-10-CM | POA: Diagnosis not present

## 2022-05-07 DIAGNOSIS — N816 Rectocele: Secondary | ICD-10-CM | POA: Diagnosis not present

## 2022-05-07 DIAGNOSIS — N9489 Other specified conditions associated with female genital organs and menstrual cycle: Secondary | ICD-10-CM | POA: Diagnosis not present

## 2022-05-07 DIAGNOSIS — Z4689 Encounter for fitting and adjustment of other specified devices: Secondary | ICD-10-CM | POA: Diagnosis not present

## 2022-05-12 ENCOUNTER — Other Ambulatory Visit: Payer: Self-pay | Admitting: Family

## 2022-05-12 DIAGNOSIS — I10 Essential (primary) hypertension: Secondary | ICD-10-CM

## 2022-05-14 ENCOUNTER — Other Ambulatory Visit: Payer: Self-pay | Admitting: Family

## 2022-05-14 DIAGNOSIS — E7800 Pure hypercholesterolemia, unspecified: Secondary | ICD-10-CM

## 2022-05-14 NOTE — Telephone Encounter (Signed)
Requested medications are due for refill today.  yes  Requested medications are on the active medications list.  yes  Last refill. 12/29/2021 #180 0 rf  Future visit scheduled.   yes  Notes to clinic.  Labs are expired.    Requested Prescriptions  Pending Prescriptions Disp Refills   carvedilol (COREG) 3.125 MG tablet [Pharmacy Med Name: CARVEDILOL 3.125 MG TABLET] 180 tablet 0    Sig: TAKE 1 TABLET BY MOUTH TWICE A DAY WITH FOOD     Cardiovascular: Beta Blockers 3 Failed - 05/12/2022  8:56 AM      Failed - Cr in normal range and within 360 days    Creatinine, Ser  Date Value Ref Range Status  05/10/2021 2.03 (H) 0.57 - 1.00 mg/dL Final         Failed - AST in normal range and within 360 days    AST  Date Value Ref Range Status  11/11/2018 19 0 - 40 IU/L Final         Failed - ALT in normal range and within 360 days    ALT  Date Value Ref Range Status  11/11/2018 18 0 - 32 IU/L Final         Passed - Last BP in normal range    BP Readings from Last 1 Encounters:  05/02/22 132/65         Passed - Last Heart Rate in normal range    Pulse Readings from Last 1 Encounters:  05/02/22 (!) 21         Passed - Valid encounter within last 6 months    Recent Outpatient Visits           1 week ago Controlled type 2 diabetes mellitus with stage 4 chronic kidney disease, without long-term current use of insulin ([place])   [place] [facility], [place], NP   6 months ago Essential (primary) hypertension   [place] [facility] at [facility], [place], NP   1 year ago Essential (primary) hypertension   [place] [facility] at [facility], [place], NP   1 year ago Essential hypertension   [place] [facility] at [facility], Amy J, NP   1 year ago Controlled type 2 diabetes mellitus with stage 4 chronic kidney disease, without long-term current use of insulin [facility])   [place] [facility] at [facility], Bayard Beaver, MD       Future Appointments             In 2 months Camillia Herter, NP [facility] at [facility]

## 2022-05-16 DIAGNOSIS — N184 Chronic kidney disease, stage 4 (severe): Secondary | ICD-10-CM | POA: Diagnosis not present

## 2022-06-13 ENCOUNTER — Ambulatory Visit: Payer: Medicare Other

## 2022-06-13 DIAGNOSIS — M21612 Bunion of left foot: Secondary | ICD-10-CM | POA: Diagnosis not present

## 2022-06-13 DIAGNOSIS — B351 Tinea unguium: Secondary | ICD-10-CM | POA: Diagnosis not present

## 2022-06-13 DIAGNOSIS — L84 Corns and callosities: Secondary | ICD-10-CM | POA: Diagnosis not present

## 2022-06-13 DIAGNOSIS — L603 Nail dystrophy: Secondary | ICD-10-CM | POA: Diagnosis not present

## 2022-06-13 DIAGNOSIS — E1351 Other specified diabetes mellitus with diabetic peripheral angiopathy without gangrene: Secondary | ICD-10-CM | POA: Diagnosis not present

## 2022-06-13 DIAGNOSIS — M21611 Bunion of right foot: Secondary | ICD-10-CM | POA: Diagnosis not present

## 2022-06-18 ENCOUNTER — Ambulatory Visit
Admission: RE | Admit: 2022-06-18 | Discharge: 2022-06-18 | Disposition: A | Discharge status: Home / Self Care | Payer: Medicare Other | Source: Ambulatory Visit | Attending: Family | Admitting: Family

## 2022-06-18 DIAGNOSIS — Z1231 Encounter for screening mammogram for malignant neoplasm of breast: Secondary | ICD-10-CM

## 2022-07-02 DIAGNOSIS — I129 Hypertensive chronic kidney disease with stage 1 through stage 4 chronic kidney disease, or unspecified chronic kidney disease: Secondary | ICD-10-CM | POA: Diagnosis not present

## 2022-07-02 DIAGNOSIS — N184 Chronic kidney disease, stage 4 (severe): Secondary | ICD-10-CM | POA: Diagnosis not present

## 2022-07-02 DIAGNOSIS — D631 Anemia in chronic kidney disease: Secondary | ICD-10-CM | POA: Diagnosis not present

## 2022-07-02 DIAGNOSIS — E1122 Type 2 diabetes mellitus with diabetic chronic kidney disease: Secondary | ICD-10-CM | POA: Diagnosis not present

## 2022-07-02 DIAGNOSIS — E559 Vitamin D deficiency, unspecified: Secondary | ICD-10-CM | POA: Diagnosis not present

## 2022-07-02 DIAGNOSIS — R609 Edema, unspecified: Secondary | ICD-10-CM | POA: Diagnosis not present

## 2022-07-23 DIAGNOSIS — I10 Essential (primary) hypertension: Secondary | ICD-10-CM | POA: Diagnosis not present

## 2022-07-23 DIAGNOSIS — E0590 Thyrotoxicosis, unspecified without thyrotoxic crisis or storm: Secondary | ICD-10-CM | POA: Diagnosis not present

## 2022-07-23 DIAGNOSIS — E1165 Type 2 diabetes mellitus with hyperglycemia: Secondary | ICD-10-CM | POA: Diagnosis not present

## 2022-07-23 DIAGNOSIS — E7800 Pure hypercholesterolemia, unspecified: Secondary | ICD-10-CM | POA: Diagnosis not present

## 2022-07-23 DIAGNOSIS — N182 Chronic kidney disease, stage 2 (mild): Secondary | ICD-10-CM | POA: Diagnosis not present

## 2022-07-27 NOTE — Progress Notes (Unsigned)
Patient ID: Carolyn Bowen, female    DOB: 12-25-1927  MRN: [id number]  CC: Chronic Care Management  Subjective: Carolyn Bowen is a 89 y.o. female who presents for chronic care management.   Her concerns today include:  Patient presents for diabetes follow-up. Reports she is doing well. No issues/concerns for discussion today.   Patient Active Problem List   Diagnosis Date Noted   Unspecified atherosclerosis of native arteries of extremities, bilateral legs (HCC) 04/25/2022   Diabetic peripheral angiopathy (HCC) 04/03/2021   Hypercholesterolemia 11/09/2020   Hyperglycemia due to type 2 diabetes mellitus (HCC) 11/09/2020   Positive for microalbuminuria 02/25/2019   Lichen planus 02/24/2019   Anemia, chronic disease 12/30/2018   Chronic kidney disease, stage 2 (mild) 12/30/2018   Vaginal prolapse 11/11/2018   Controlled type 2 diabetes mellitus without complication, without long-term current use of insulin (HCC) 11/11/2018   Thyrotoxicosis 11/11/2018   Vaginal bleeding 11/11/2018   Hematuria, gross 11/11/2018   Essential hypertension 09/06/2014     Current Outpatient Medications on File Prior to Visit  Medication Sig Dispense Refill   amLODipine (NORVASC) 10 MG tablet TAKE 1 TABLET BY MOUTH EVERY DAY 90 tablet 1   aspirin 81 MG chewable tablet Chew 81 mg by mouth daily.     atorvastatin (LIPITOR) 20 MG tablet TAKE 1 TABLET BY MOUTH EVERY DAY 90 tablet 1   carvedilol (COREG) 3.125 MG tablet TAKE 1 TABLET BY MOUTH TWICE A DAY WITH FOOD 180 tablet 0   Cholecalciferol (VITAMIN D3) 50 MCG (2000 UT) capsule 1 capsule     esomeprazole (NEXIUM) 20 MG capsule Take 20 mg by mouth daily as needed (heartburn).     estradiol (ESTRACE) 0.1 MG/GM vaginal cream Place 1 g vaginally 2 (two) times a week.     fexofenadine (ALLEGRA) 180 MG tablet Take 180 mg by mouth daily.     furosemide (LASIX) 40 MG tablet Take 1 tablet by mouth daily as needed.     losartan (COZAAR) 100 MG tablet TAKE 1  TABLET BY MOUTH EVERY DAY 30 tablet 2   methimazole (TAPAZOLE) 5 MG tablet Take by mouth.     pseudoephedrine-guaifenesin (MUCINEX D) 60-600 MG 12 hr tablet Take 1 tablet by mouth every 12 (twelve) hours.     CVS D3 50 MCG (2000 UT) CAPS Take 1 capsule by mouth daily.     glucose blood (PRODIGY NO CODING BLOOD GLUC) test strip 1 each by Other route as needed for other. Use as instructed 100 each 11   No current facility-administered medications on file prior to visit.    Allergies  Allergen Reactions   Codeine Other (See Comments)   Penicillin G Benzathine Other (See Comments)   Penicillins Hives   Amlodipine Besy-Benazepril Hcl Rash    Social History   Socioeconomic History   Marital status: Widowed    Spouse name: Not on file   Number of children: Not on file   Years of education: Not on file   Highest education level: Not on file  Occupational History   Not on file  Tobacco Use   Smoking status: Never    Passive exposure: Never   Smokeless tobacco: Never  Vaping Use   Vaping Use: Never used  Substance and Sexual Activity   Alcohol use: No    Alcohol/week: 0.0 standard drinks of alcohol   Drug use: No   Sexual activity: Not Currently  Other Topics Concern   Not on file  Social History  Narrative   Not on file   Social Determinants of Health   Financial Resource Strain: Low Risk  (03/15/2022)   Overall Financial Resource Strain (CARDIA)    Difficulty of Paying Living Expenses: Not hard at all  Food Insecurity: No Food Insecurity (03/15/2022)   Hunger Vital Sign    Worried About Running Out of Food in the Last Year: Never true    Ran Out of Food in the Last Year: Never true  Transportation Needs: No Transportation Needs (03/15/2022)   PRAPARE - Administrator, Civil Service (Medical): No    Lack of Transportation (Non-Medical): No  Physical Activity: Inactive (03/15/2022)   Exercise Vital Sign    Days of Exercise per Week: 0 days    Minutes of  Exercise per Session: 0 min  Stress: No Stress Concern Present (03/15/2022)   [employer] of Occupational Health - Occupational Stress Questionnaire    Feeling of Stress : Not at all  Social Connections: Moderately Isolated (12/10/2020)   Social Connection and Isolation Panel [NHANES]    Frequency of Communication with Friends and Family: More than three times a week    Frequency of Social Gatherings with Friends and Family: Once a week    Attends Religious Services: 1 to 4 times per year    Active Member of [employer] or Organizations: No    Attends Banker Meetings: Never    Marital Status: Widowed  Catering manager Violence: Not on file    Family History  Problem Relation Age of Onset   Heart disease Mother    Cancer Father    Heart disease Brother    Breast cancer Daughter     Past Surgical History:  Procedure Laterality Date   ABDOMINAL HYSTERECTOMY     APPENDECTOMY     BREAST BIOPSY Right 2009   BREAST EXCISIONAL BIOPSY Right 2002   CATARACT EXTRACTION  2019   HERNIA REPAIR      ROS: Review of Systems Negative except as stated above  PHYSICAL EXAM: BP 138/80 (BP Location: Right Arm, Patient Position: Sitting, Cuff Size: Normal)   Pulse 62   Temp 97.8 F (36.6 C) (Oral)   Resp 16   Wt 140 lb (63.5 kg)   SpO2 98%   BMI 25.09 kg/m   Physical Exam Eyes:     Extraocular Movements: Extraocular movements intact.     Conjunctiva/sclera: Conjunctivae normal.     Pupils: Pupils are equal, round, and reactive to light.  Cardiovascular:     Rate and Rhythm: Normal rate and regular rhythm.     Pulses: Normal pulses.     Heart sounds: Normal heart sounds.  Pulmonary:     Effort: Pulmonary effort is normal.     Breath sounds: Normal breath sounds.  Musculoskeletal:     Cervical back: Normal range of motion and neck supple.  Neurological:     General: No focal deficit present.     Mental Status: She is alert and oriented to person, place, and time.   Psychiatric:        Mood and Affect: Mood normal.        Behavior: Behavior normal.      Results for orders placed or performed in visit on 08/01/22  POCT glycosylated hemoglobin (Hb A1C)  Result Value Ref Range   Hemoglobin A1C     HbA1c POC (<> result, manual entry)     HbA1c, POC (prediabetic range)     HbA1c, POC (controlled  diabetic range) 7.4 (A) 0.0 - 7.0 %     ASSESSMENT AND PLAN: 1. Controlled type 2 diabetes mellitus with stage 4 chronic kidney disease, without long-term current use of insulin (HCC) - Hemoglobin A1c at goal today at 7.4%, goal 8%. This is improved compared to previous 7.6%. - Continue diet management. Will not start any diabetic agents given good glycemic control for age and recent history of hypoglycemia on medications.  - Follow-up with primary provider in 6 months or sooner if needed.  - POCT glycosylated hemoglobin (Hb A1C)   Patient was given the opportunity to ask questions.  Patient verbalized understanding of the plan and was able to repeat key elements of the plan. Patient was given clear instructions to go to Emergency Department or return to medical center if symptoms don't improve, worsen, or new problems develop.The patient verbalized understanding.   Orders Placed This Encounter  Procedures   POCT glycosylated hemoglobin (Hb A1C)    Return in about 6 months (around 02/01/2023) for Follow-Up or next available chronic care mgmt .  Rema Fendt, NP

## 2022-08-01 ENCOUNTER — Encounter: Payer: Self-pay | Admitting: Family

## 2022-08-01 ENCOUNTER — Ambulatory Visit (INDEPENDENT_AMBULATORY_CARE_PROVIDER_SITE_OTHER): Payer: Medicare Other | Admitting: Family

## 2022-08-01 VITALS — BP 138/80 | HR 62 | Temp 97.8°F | Resp 16 | Wt 140.0 lb

## 2022-08-01 DIAGNOSIS — E1122 Type 2 diabetes mellitus with diabetic chronic kidney disease: Secondary | ICD-10-CM

## 2022-08-01 DIAGNOSIS — N184 Chronic kidney disease, stage 4 (severe): Secondary | ICD-10-CM

## 2022-08-01 LAB — POCT GLYCOSYLATED HEMOGLOBIN (HGB A1C): HbA1c, POC (controlled diabetic range): 7.4 % — AB (ref 0.0–7.0)

## 2022-08-06 ENCOUNTER — Other Ambulatory Visit: Payer: Self-pay | Admitting: Family

## 2022-08-06 DIAGNOSIS — I10 Essential (primary) hypertension: Secondary | ICD-10-CM

## 2022-08-07 NOTE — Telephone Encounter (Signed)
Requested medication (s) are due for refill today: yes  Requested medication (s) are on the active medication list: yes  Last refill:  05/14/22 #180/0  Future visit scheduled: yes  Notes to clinic:  Unable to refill per protocol due to failed labs, no updated results.    Requested Prescriptions  Pending Prescriptions Disp Refills   carvedilol (COREG) 3.125 MG tablet [Pharmacy Med Name: CARVEDILOL 3.125 MG TABLET] 180 tablet 0    Sig: TAKE 1 TABLET BY MOUTH TWICE A DAY WITH FOOD     Cardiovascular: Beta Blockers 3 Failed - 08/06/2022  8:31 AM      Failed - Cr in normal range and within 360 days    Creatinine, Ser  Date Value Ref Range Status  05/10/2021 2.03 (H) 0.57 - 1.00 mg/dL Final         Failed - AST in normal range and within 360 days    AST  Date Value Ref Range Status  11/11/2018 19 0 - 40 IU/L Final         Failed - ALT in normal range and within 360 days    ALT  Date Value Ref Range Status  11/11/2018 18 0 - 32 IU/L Final         Passed - Last BP in normal range    BP Readings from Last 1 Encounters:  08/01/22 138/80         Passed - Last Heart Rate in normal range    Pulse Readings from Last 1 Encounters:  08/01/22 62         Passed - Valid encounter within last 6 months    Recent Outpatient Visits           6 days ago Controlled type 2 diabetes mellitus with stage 4 chronic kidney disease, without long-term current use of insulin (HCC)   [place] [facility] at [facility], Amy J, NP   3 months ago Controlled type 2 diabetes mellitus with stage 4 chronic kidney disease, without long-term current use of insulin (HCC)   [place] [facility] at [facility], [place], NP   9 months ago Essential (primary) hypertension   [place] [facility] at [facility], [place], NP   1 year ago Essential (primary) hypertension   [place] [facility] at [facility], [place], NP   1 year ago Essential hypertension    [place] [facility] at [facility], [place], NP       Future Appointments             In 6 months Rema Fendt, NP [facility] at [facility]

## 2022-08-10 ENCOUNTER — Other Ambulatory Visit: Payer: Self-pay | Admitting: Family

## 2022-08-10 DIAGNOSIS — I10 Essential (primary) hypertension: Secondary | ICD-10-CM

## 2022-08-10 NOTE — Telephone Encounter (Signed)
Requested Prescriptions  Pending Prescriptions Disp Refills   losartan (COZAAR) 100 MG tablet [Pharmacy Med Name: LOSARTAN POTASSIUM 100 MG TAB] 90 tablet 0    Sig: TAKE 1 TABLET BY MOUTH EVERY DAY     Cardiovascular:  Angiotensin Receptor Blockers Failed - 08/10/2022  2:46 AM      Failed - Cr in normal range and within 180 days    Creatinine, Ser  Date Value Ref Range Status  05/10/2021 2.03 (H) 0.57 - 1.00 mg/dL Final         Failed - K in normal range and within 180 days    Potassium  Date Value Ref Range Status  05/10/2021 4.6 3.5 - 5.2 mmol/L Final  08/03/2019 5.0  Final         Passed - Patient is not pregnant      Passed - Last BP in normal range    BP Readings from Last 1 Encounters:  08/01/22 138/80         Passed - Valid encounter within last 6 months    Recent Outpatient Visits           1 week ago Controlled type 2 diabetes mellitus with stage 4 chronic kidney disease, without long-term current use of insulin (HCC)   [place] [facility] at [facility], Amy J, NP   3 months ago Controlled type 2 diabetes mellitus with stage 4 chronic kidney disease, without long-term current use of insulin (HCC)   [place] [facility] at [facility], Amy J, NP   9 months ago Essential (primary) hypertension   [place] [facility] at [facility], [place], NP   1 year ago Essential (primary) hypertension   [place] [facility] at [facility], [place], NP   1 year ago Essential hypertension   [place] [facility] at [facility], [place], NP       Future Appointments             In 5 months Rema Fendt, NP [facility] at [facility]

## 2022-08-15 DIAGNOSIS — N184 Chronic kidney disease, stage 4 (severe): Secondary | ICD-10-CM | POA: Diagnosis not present

## 2022-08-15 DIAGNOSIS — I10 Essential (primary) hypertension: Secondary | ICD-10-CM | POA: Diagnosis not present

## 2022-08-15 DIAGNOSIS — E782 Mixed hyperlipidemia: Secondary | ICD-10-CM | POA: Diagnosis not present

## 2022-08-15 DIAGNOSIS — E1122 Type 2 diabetes mellitus with diabetic chronic kidney disease: Secondary | ICD-10-CM | POA: Diagnosis not present

## 2022-08-17 ENCOUNTER — Other Ambulatory Visit: Payer: Self-pay | Admitting: Family

## 2022-08-17 DIAGNOSIS — I10 Essential (primary) hypertension: Secondary | ICD-10-CM

## 2022-08-17 NOTE — Telephone Encounter (Signed)
Requested medications are due for refill today.  yes  Requested medications are on the active medications list.  yes  Last refill. 05/14/2022 #180 0 rf  Future visit scheduled.   yes  Notes to clinic.  Labs are expired.    Requested Prescriptions  Pending Prescriptions Disp Refills   carvedilol (COREG) 3.125 MG tablet 180 tablet 0    Sig: Take 1 tablet (3.125 mg total) by mouth 2 (two) times daily with a meal.     Cardiovascular: Beta Blockers 3 Failed - 08/17/2022  4:46 PM      Failed - Cr in normal range and within 360 days    Creatinine, Ser  Date Value Ref Range Status  05/10/2021 2.03 (H) 0.57 - 1.00 mg/dL Final         Failed - AST in normal range and within 360 days    AST  Date Value Ref Range Status  11/11/2018 19 0 - 40 IU/L Final         Failed - ALT in normal range and within 360 days    ALT  Date Value Ref Range Status  11/11/2018 18 0 - 32 IU/L Final         Passed - Last BP in normal range    BP Readings from Last 1 Encounters:  08/01/22 138/80         Passed - Last Heart Rate in normal range    Pulse Readings from Last 1 Encounters:  08/01/22 62         Passed - Valid encounter within last 6 months    Recent Outpatient Visits           2 weeks ago Controlled type 2 diabetes mellitus with stage 4 chronic kidney disease, without long-term current use of insulin (HCC)   [place] [facility] at [facility], Amy J, NP   3 months ago Controlled type 2 diabetes mellitus with stage 4 chronic kidney disease, without long-term current use of insulin (HCC)   [place] [facility], Amy J, NP   9 months ago Essential (primary) hypertension   [place] [facility] at [facility], [place], NP   1 year ago Essential (primary) hypertension   [place] [facility] at [facility], [place], NP   1 year ago Essential hypertension   [place] [facility] at [facility], [place], NP        Future Appointments             In 5 months Rema Fendt, NP [facility] at [facility]

## 2022-08-17 NOTE — Telephone Encounter (Signed)
Medication Refill - Medication: carvedilol (COREG) 3.125 MG tablet [Pharmacy Med Name: CARVEDILOL 3.125 MG TABLET] [[id number]]   Has the patient contacted their pharmacy? Yes.     Preferred Pharmacy (with phone number or street name):  [id number] [place], [place] - [address] [place] [address] Phone: [phone]  Fax: [phone]     Has the patient been seen for an appointment in the last year OR does the patient have an upcoming appointment? Yes.    Agent: Please be advised that RX refills may take up to 3 business days. We ask that you follow-up with your pharmacy.

## 2022-08-20 ENCOUNTER — Other Ambulatory Visit: Payer: Self-pay | Admitting: Family

## 2022-08-20 DIAGNOSIS — I10 Essential (primary) hypertension: Secondary | ICD-10-CM

## 2022-08-20 NOTE — Telephone Encounter (Signed)
Medication Refill - Medication: carvedilol (COREG) 3.125 MG tablet       ( 2nd call, called two weeks ago and  pt was told she would get it then, pharmacy today said they have not received rx )    Has the patient contacted their pharmacy? Yes.    Preferred Pharmacy (with phone number or street name):   [id number] [place], [place] - [address]  [address] [place] [address]  Phone: [phone] Fax: [phone]  Hours: Not open 24 hours       Has the patient been seen for an appointment in the last year OR does the patient have an upcoming appointment? Yes.    Agent: Please be advised that RX refills may take up to 3 business days. We ask that you follow-up with your pharmacy.

## 2022-08-21 NOTE — Telephone Encounter (Signed)
Requested medication (s) are due for refill today: Yes  Requested medication (s) are on the active medication list: Yes  Last refill:  05/14/22  Future visit scheduled: Yes  Notes to clinic:  Protocol indicates lab work needed.    Requested Prescriptions  Pending Prescriptions Disp Refills   carvedilol (COREG) 3.125 MG tablet 180 tablet 0    Sig: Take 1 tablet (3.125 mg total) by mouth 2 (two) times daily with a meal.     Cardiovascular: Beta Blockers 3 Failed - 08/20/2022 11:29 AM      Failed - Cr in normal range and within 360 days    Creatinine, Ser  Date Value Ref Range Status  05/10/2021 2.03 (H) 0.57 - 1.00 mg/dL Final         Failed - AST in normal range and within 360 days    AST  Date Value Ref Range Status  11/11/2018 19 0 - 40 IU/L Final         Failed - ALT in normal range and within 360 days    ALT  Date Value Ref Range Status  11/11/2018 18 0 - 32 IU/L Final         Passed - Last BP in normal range    BP Readings from Last 1 Encounters:  08/01/22 138/80         Passed - Last Heart Rate in normal range    Pulse Readings from Last 1 Encounters:  08/01/22 62         Passed - Valid encounter within last 6 months    Recent Outpatient Visits           2 weeks ago Controlled type 2 diabetes mellitus with stage 4 chronic kidney disease, without long-term current use of insulin (HCC)   [place] [facility], Amy J, NP   3 months ago Controlled type 2 diabetes mellitus with stage 4 chronic kidney disease, without long-term current use of insulin (HCC)   [place] [facility], Amy J, NP   9 months ago Essential (primary) hypertension   [place] [facility], [place], [place]   1 year ago Essential (primary) hypertension   [place] [facility], [place], NP   1 year ago Essential hypertension   [place] [facility],  [place], [place]       Future Appointments             In 5 months Rema Fendt, NP [facility] at [facility]

## 2022-08-22 ENCOUNTER — Other Ambulatory Visit: Payer: Self-pay | Admitting: Family

## 2022-08-22 DIAGNOSIS — I10 Essential (primary) hypertension: Secondary | ICD-10-CM

## 2022-08-22 NOTE — Telephone Encounter (Signed)
Requested medication (s) are due for refill today:   Yes  Requested medication (s) are on the active medication list:   Yes  Future visit scheduled:   Yes in 5 months.      Seen 3 weeks ago by Ricky Stabs, NP   Last ordered: Coreg 05/14/2022 #180, 0 refills   Been requested 08/17/2022 and 08/20/2022.     Pt. Took her last pill over a week ago.  Returned because labs are due per the protocol.      Requested Prescriptions  Pending Prescriptions Disp Refills   carvedilol (COREG) 3.125 MG tablet 180 tablet 0    Sig: Take 1 tablet (3.125 mg total) by mouth 2 (two) times daily with a meal.     Cardiovascular: Beta Blockers 3 Failed - 08/22/2022 11:21 AM      Failed - Cr in normal range and within 360 days    Creatinine, Ser  Date Value Ref Range Status  05/10/2021 2.03 (H) 0.57 - 1.00 mg/dL Final         Failed - AST in normal range and within 360 days    AST  Date Value Ref Range Status  11/11/2018 19 0 - 40 IU/L Final         Failed - ALT in normal range and within 360 days    ALT  Date Value Ref Range Status  11/11/2018 18 0 - 32 IU/L Final         Passed - Last BP in normal range    BP Readings from Last 1 Encounters:  08/01/22 138/80         Passed - Last Heart Rate in normal range    Pulse Readings from Last 1 Encounters:  08/01/22 62         Passed - Valid encounter within last 6 months    Recent Outpatient Visits           3 weeks ago Controlled type 2 diabetes mellitus with stage 4 chronic kidney disease, without long-term current use of insulin (HCC)   [place] [facility], Amy J, NP   3 months ago Controlled type 2 diabetes mellitus with stage 4 chronic kidney disease, without long-term current use of insulin (HCC)   [place] [facility], Amy J, NP   9 months ago Essential (primary) hypertension   [place] [facility], [place], NP   1 year ago Essential (primary)  hypertension   [place] [facility], [place], NP   1 year ago Essential hypertension   [place] [facility], [place], NP       Future Appointments             In 5 months Rema Fendt, NP [facility]

## 2022-08-22 NOTE — Telephone Encounter (Signed)
Medication Refill - Medication: carvedilol (COREG) 3.125 MG tablet  Has the patient contacted their pharmacy? Yes.   Pharmacy stated they did not have the script and pt needed to contact provider  Preferred Pharmacy (with phone number or street name): [id number] [place], [place] - [address]  Phone: [phone] Fax: [phone]  Has the patient been seen for an appointment in the last year OR does the patient have an upcoming appointment? No.  Agent: Please be advised that RX refills may take up to 3 business days. We ask that you follow-up with your pharmacy.  Patient said she took her last pill over a week ago

## 2022-08-23 ENCOUNTER — Other Ambulatory Visit: Payer: Self-pay | Admitting: Family

## 2022-08-23 ENCOUNTER — Telehealth: Payer: Self-pay | Admitting: Family

## 2022-08-23 DIAGNOSIS — I10 Essential (primary) hypertension: Secondary | ICD-10-CM

## 2022-08-23 MED ORDER — CARVEDILOL 3.125 MG PO TABS
3.1250 mg | ORAL_TABLET | Freq: Two times a day (BID) | ORAL | 0 refills | Status: DC
Start: 2022-08-23 — End: 2022-11-20

## 2022-08-23 NOTE — Telephone Encounter (Signed)
Daughter came in to get carvedilol  refilled and sent to [facility]

## 2022-08-23 NOTE — Telephone Encounter (Signed)
Daughter came in refill for Bp medicine sent to [facility] pharmacy

## 2022-08-23 NOTE — Telephone Encounter (Signed)
Carvedilol prescribed.

## 2022-09-10 DIAGNOSIS — M21611 Bunion of right foot: Secondary | ICD-10-CM | POA: Diagnosis not present

## 2022-09-10 DIAGNOSIS — M21612 Bunion of left foot: Secondary | ICD-10-CM | POA: Diagnosis not present

## 2022-09-10 DIAGNOSIS — L603 Nail dystrophy: Secondary | ICD-10-CM | POA: Diagnosis not present

## 2022-09-10 DIAGNOSIS — E1351 Other specified diabetes mellitus with diabetic peripheral angiopathy without gangrene: Secondary | ICD-10-CM | POA: Diagnosis not present

## 2022-09-10 DIAGNOSIS — L84 Corns and callosities: Secondary | ICD-10-CM | POA: Diagnosis not present

## 2022-09-10 DIAGNOSIS — B351 Tinea unguium: Secondary | ICD-10-CM | POA: Diagnosis not present

## 2022-10-01 ENCOUNTER — Inpatient Hospital Stay
Admission: RE | Admit: 2022-10-01 | Discharge: 2022-10-01 | Disposition: A | Discharge status: Home / Self Care | Payer: Medicare Other | Source: Ambulatory Visit | Attending: Obstetrics and Gynecology | Admitting: Obstetrics and Gynecology

## 2022-10-01 ENCOUNTER — Other Ambulatory Visit: Payer: Self-pay | Admitting: Obstetrics and Gynecology

## 2022-10-01 DIAGNOSIS — Z780 Asymptomatic menopausal state: Secondary | ICD-10-CM

## 2022-10-01 DIAGNOSIS — E349 Endocrine disorder, unspecified: Secondary | ICD-10-CM | POA: Diagnosis not present

## 2022-10-01 DIAGNOSIS — N958 Other specified menopausal and perimenopausal disorders: Secondary | ICD-10-CM | POA: Diagnosis not present

## 2022-10-22 DIAGNOSIS — H52223 Regular astigmatism, bilateral: Secondary | ICD-10-CM | POA: Diagnosis not present

## 2022-10-22 DIAGNOSIS — H5203 Hypermetropia, bilateral: Secondary | ICD-10-CM | POA: Diagnosis not present

## 2022-10-22 DIAGNOSIS — H53002 Unspecified amblyopia, left eye: Secondary | ICD-10-CM | POA: Diagnosis not present

## 2022-10-22 DIAGNOSIS — Z135 Encounter for screening for eye and ear disorders: Secondary | ICD-10-CM | POA: Diagnosis not present

## 2022-10-22 DIAGNOSIS — Z961 Presence of intraocular lens: Secondary | ICD-10-CM | POA: Diagnosis not present

## 2022-10-27 ENCOUNTER — Other Ambulatory Visit: Payer: Self-pay | Admitting: Family

## 2022-10-27 DIAGNOSIS — I10 Essential (primary) hypertension: Secondary | ICD-10-CM

## 2022-10-27 DIAGNOSIS — E7800 Pure hypercholesterolemia, unspecified: Secondary | ICD-10-CM

## 2022-10-29 ENCOUNTER — Other Ambulatory Visit: Payer: Self-pay

## 2022-10-29 ENCOUNTER — Other Ambulatory Visit: Payer: Self-pay | Admitting: Family

## 2022-10-29 DIAGNOSIS — E7800 Pure hypercholesterolemia, unspecified: Secondary | ICD-10-CM

## 2022-10-29 DIAGNOSIS — I10 Essential (primary) hypertension: Secondary | ICD-10-CM

## 2022-10-29 MED ORDER — AMLODIPINE BESYLATE 10 MG PO TABS
10.0000 mg | ORAL_TABLET | Freq: Every day | ORAL | 0 refills | Status: DC
Start: 2022-10-29 — End: 2023-02-01

## 2022-10-29 MED ORDER — ATORVASTATIN CALCIUM 20 MG PO TABS: 20.0000 mg | ORAL_TABLET | Freq: Every day | ORAL | 1 refills | Status: DC

## 2022-10-29 NOTE — Telephone Encounter (Signed)
-   Amlodipine prescribed.  - Atorvastatin prescribed 10/29/2022.

## 2022-10-29 NOTE — Telephone Encounter (Signed)
Complete

## 2022-11-05 ENCOUNTER — Other Ambulatory Visit: Payer: Self-pay | Admitting: Family

## 2022-11-05 DIAGNOSIS — I10 Essential (primary) hypertension: Secondary | ICD-10-CM

## 2022-11-06 NOTE — Telephone Encounter (Signed)
Complete

## 2022-11-14 DIAGNOSIS — E1122 Type 2 diabetes mellitus with diabetic chronic kidney disease: Secondary | ICD-10-CM | POA: Diagnosis not present

## 2022-11-14 DIAGNOSIS — N184 Chronic kidney disease, stage 4 (severe): Secondary | ICD-10-CM | POA: Diagnosis not present

## 2022-11-14 DIAGNOSIS — E782 Mixed hyperlipidemia: Secondary | ICD-10-CM | POA: Diagnosis not present

## 2022-11-14 DIAGNOSIS — I10 Essential (primary) hypertension: Secondary | ICD-10-CM | POA: Diagnosis not present

## 2022-11-18 ENCOUNTER — Other Ambulatory Visit: Payer: Self-pay | Admitting: Family

## 2022-11-18 DIAGNOSIS — I10 Essential (primary) hypertension: Secondary | ICD-10-CM

## 2022-11-20 ENCOUNTER — Other Ambulatory Visit: Payer: Self-pay

## 2022-11-20 DIAGNOSIS — I10 Essential (primary) hypertension: Secondary | ICD-10-CM

## 2022-11-20 MED ORDER — CARVEDILOL 3.125 MG PO TABS
3.1250 mg | ORAL_TABLET | Freq: Two times a day (BID) | ORAL | 0 refills | Status: DC
Start: 2022-11-20 — End: 2023-05-07

## 2022-12-26 DIAGNOSIS — N184 Chronic kidney disease, stage 4 (severe): Secondary | ICD-10-CM | POA: Diagnosis not present

## 2022-12-26 DIAGNOSIS — N765 Ulceration of vagina: Secondary | ICD-10-CM | POA: Diagnosis not present

## 2022-12-26 DIAGNOSIS — N819 Female genital prolapse, unspecified: Secondary | ICD-10-CM | POA: Diagnosis not present

## 2022-12-31 DIAGNOSIS — E1122 Type 2 diabetes mellitus with diabetic chronic kidney disease: Secondary | ICD-10-CM | POA: Diagnosis not present

## 2022-12-31 DIAGNOSIS — E559 Vitamin D deficiency, unspecified: Secondary | ICD-10-CM | POA: Diagnosis not present

## 2022-12-31 DIAGNOSIS — I129 Hypertensive chronic kidney disease with stage 1 through stage 4 chronic kidney disease, or unspecified chronic kidney disease: Secondary | ICD-10-CM | POA: Diagnosis not present

## 2022-12-31 DIAGNOSIS — D631 Anemia in chronic kidney disease: Secondary | ICD-10-CM | POA: Diagnosis not present

## 2022-12-31 DIAGNOSIS — R609 Edema, unspecified: Secondary | ICD-10-CM | POA: Diagnosis not present

## 2022-12-31 DIAGNOSIS — N184 Chronic kidney disease, stage 4 (severe): Secondary | ICD-10-CM | POA: Diagnosis not present

## 2023-01-09 ENCOUNTER — Telehealth: Payer: Self-pay | Admitting: Family

## 2023-01-16 DIAGNOSIS — E1122 Type 2 diabetes mellitus with diabetic chronic kidney disease: Secondary | ICD-10-CM | POA: Diagnosis not present

## 2023-01-16 DIAGNOSIS — E0590 Thyrotoxicosis, unspecified without thyrotoxic crisis or storm: Secondary | ICD-10-CM | POA: Diagnosis not present

## 2023-01-16 DIAGNOSIS — I10 Essential (primary) hypertension: Secondary | ICD-10-CM | POA: Diagnosis not present

## 2023-01-16 DIAGNOSIS — E782 Mixed hyperlipidemia: Secondary | ICD-10-CM | POA: Diagnosis not present

## 2023-01-16 DIAGNOSIS — I739 Peripheral vascular disease, unspecified: Secondary | ICD-10-CM | POA: Diagnosis not present

## 2023-01-23 DIAGNOSIS — E1165 Type 2 diabetes mellitus with hyperglycemia: Secondary | ICD-10-CM | POA: Diagnosis not present

## 2023-01-23 DIAGNOSIS — I10 Essential (primary) hypertension: Secondary | ICD-10-CM | POA: Diagnosis not present

## 2023-01-23 DIAGNOSIS — E7800 Pure hypercholesterolemia, unspecified: Secondary | ICD-10-CM | POA: Diagnosis not present

## 2023-01-23 DIAGNOSIS — E0590 Thyrotoxicosis, unspecified without thyrotoxic crisis or storm: Secondary | ICD-10-CM | POA: Diagnosis not present

## 2023-01-23 DIAGNOSIS — N182 Chronic kidney disease, stage 2 (mild): Secondary | ICD-10-CM | POA: Diagnosis not present

## 2023-02-01 ENCOUNTER — Other Ambulatory Visit: Payer: Self-pay | Admitting: Family

## 2023-02-01 DIAGNOSIS — I10 Essential (primary) hypertension: Secondary | ICD-10-CM

## 2023-02-01 MED ORDER — AMLODIPINE BESYLATE 10 MG PO TABS: 10.0000 mg | ORAL_TABLET | Freq: Every day | ORAL | 0 refills | Status: DC

## 2023-02-01 NOTE — Telephone Encounter (Signed)
Requested Prescriptions  Pending Prescriptions Disp Refills   losartan (COZAAR) 100 MG tablet 90 tablet 0    Sig: Take 1 tablet (100 mg total) by mouth daily.     Cardiovascular:  Angiotensin Receptor Blockers Failed - 02/01/2023  1:20 PM      Failed - Cr in normal range and within 180 days    Creatinine, Ser  Date Value Ref Range Status  05/10/2021 2.03 (H) 0.57 - 1.00 mg/dL Final         Failed - K in normal range and within 180 days    Potassium  Date Value Ref Range Status  05/10/2021 4.6 3.5 - 5.2 mmol/L Final  08/03/2019 5.0  Final         Failed - Valid encounter within last 6 months    Recent Outpatient Visits           6 months ago Controlled type 2 diabetes mellitus with stage 4 chronic kidney disease, without long-term current use of insulin (HCC)   [place] [facility], Amy J, NP   9 months ago Controlled type 2 diabetes mellitus with stage 4 chronic kidney disease, without long-term current use of insulin (HCC)   [place] [facility], [place], [place]   1 year ago Essential (primary) hypertension   [place] [facility], [place], NP   1 year ago Essential (primary) hypertension   [place] [facility], [place], NP   2 years ago Essential hypertension   [place] [facility] at [facility], [place], [place]       Future Appointments             In 3 days Rema Fendt, NP [place] [facility] - Patient is not pregnant      Passed - Last BP in normal range    BP Readings from Last 1 Encounters:  08/01/22 138/80          amLODipine (NORVASC) 10 MG tablet 90 tablet 0    Sig: Take 1 tablet (10 mg total) by mouth daily.     Cardiovascular: Calcium Channel Blockers 2 Failed - 02/01/2023  1:20 PM      Failed - Valid encounter within last 6 months    Recent Outpatient Visits           6  months ago Controlled type 2 diabetes mellitus with stage 4 chronic kidney disease, without long-term current use of insulin (HCC)   [place] [facility], Amy J, NP   9 months ago Controlled type 2 diabetes mellitus with stage 4 chronic kidney disease, without long-term current use of insulin (HCC)   [place] [facility], [place], NP   1 year ago Essential (primary) hypertension   [place] [facility], [place], [place]   1 year ago Essential (primary) hypertension   [place] [facility] at [facility], [place], [place]   2 years ago Essential hypertension    Primary [facility] at [facility], Salomon Fick, NP       Future Appointments             In 3 days Rema Fendt, NP [facility]            Passed - Last BP in normal range    BP Readings from Last 1 Encounters:  08/01/22 138/80         Passed - Last Heart Rate in normal range    Pulse Readings from Last 1 Encounters:  08/01/22 62

## 2023-02-01 NOTE — Telephone Encounter (Signed)
Requested medications are due for refill today.  yes  Requested medications are on the active medications list.  yes  Last refill. 11/06/2022 #90 0 rf  Future visit scheduled.   yes  Notes to clinic.  Labs are expired.    Requested Prescriptions  Pending Prescriptions Disp Refills   losartan (COZAAR) 100 MG tablet 90 tablet 0    Sig: Take 1 tablet (100 mg total) by mouth daily.     Cardiovascular:  Angiotensin Receptor Blockers Failed - 02/01/2023  1:20 PM      Failed - Cr in normal range and within 180 days    Creatinine, Ser  Date Value Ref Range Status  05/10/2021 2.03 (H) 0.57 - 1.00 mg/dL Final         Failed - K in normal range and within 180 days    Potassium  Date Value Ref Range Status  05/10/2021 4.6 3.5 - 5.2 mmol/L Final  08/03/2019 5.0  Final         Failed - Valid encounter within last 6 months    Recent Outpatient Visits           6 months ago Controlled type 2 diabetes mellitus with stage 4 chronic kidney disease, without long-term current use of insulin (HCC)   [place] [facility], Amy J, NP   9 months ago Controlled type 2 diabetes mellitus with stage 4 chronic kidney disease, without long-term current use of insulin (HCC)   [place] [facility], [place], [place]   1 year ago Essential (primary) hypertension   [place] [facility] at [facility], [place], [place]   1 year ago Essential (primary) hypertension   [place] [facility] at [facility], [place], NP   2 years ago Essential hypertension   [place] [facility] at [facility], [place], [place]       Future Appointments             In 3 days Rema Fendt, NP [place] [facility] - Patient is not pregnant      Passed - Last BP in normal range    BP Readings from Last 1 Encounters:  08/01/22 138/80         Signed Prescriptions Disp Refills   amLODipine  (NORVASC) 10 MG tablet 90 tablet 0    Sig: Take 1 tablet (10 mg total) by mouth daily.     Cardiovascular: Calcium Channel Blockers 2 Failed - 02/01/2023  1:20 PM      Failed - Valid encounter within last 6 months    Recent Outpatient Visits           6 months ago Controlled type 2 diabetes mellitus with stage 4 chronic kidney disease, without long-term current use of insulin (HCC)   [place] [facility], Amy J, NP   9 months ago Controlled type 2 diabetes mellitus with stage 4 chronic kidney disease, without long-term current use of insulin (HCC)   [place] [facility], [place], [place]   1 year ago Essential (primary) hypertension   [place] [facility] at [facility], [place], [place]   1 year ago Essential (primary) hypertension    [facility] at [facility], Sal[place] Fick, NP   2  years ago Essential hypertension   [place] [facility], [place], NP       Future Appointments             In 3 days Rema Fendt, NP [facility] - Last BP in normal range    BP Readings from Last 1 Encounters:  08/01/22 138/80         Passed - Last Heart Rate in normal range    Pulse Readings from Last 1 Encounters:  08/01/22 62

## 2023-02-01 NOTE — Telephone Encounter (Unsigned)
Copied from CRM [id number]. Topic: General - Other >> Feb 01, 2023 11:50 AM Everette C wrote: Reason for CRM: Medication Refill - Most Recent Primary Care Visit:  Provider: Rema Fendt Department: [facility] Visit Type: OFFICE VISIT Date: 08/01/2022  Medication: losartan (COZAAR) 100 MG tablet [[id number]]  amLODipine (NORVASC) 10 MG tablet [[id number]]  Has the patient contacted their pharmacy? Yes. The patient has been directed to contact their PCP  (Agent: If no, request that the patient contact the pharmacy for the refill. If patient does not wish to contact the pharmacy document the reason why and proceed with request.) (Agent: If yes, when and what did the pharmacy advise?)  Is this the correct pharmacy for this prescription? Yes If no, delete pharmacy and type the correct one.  This is the patient's preferred pharmacy:  [facility] [address], [place] - [address] [place] [address] Phone: [phone] Fax: [phone]   Has the prescription been filled recently? Yes  Is the patient out of the medication? No  Has the patient been seen for an appointment in the last year OR does the patient have an upcoming appointment? Yes  Can we respond through MyChart? No  Agent: Please be advised that Rx refills may take up to 3 business days. We ask that you follow-up with your pharmacy.

## 2023-02-04 ENCOUNTER — Encounter: Payer: Self-pay | Admitting: Family

## 2023-02-04 ENCOUNTER — Ambulatory Visit (INDEPENDENT_AMBULATORY_CARE_PROVIDER_SITE_OTHER): Payer: Medicare Other | Admitting: Family

## 2023-02-04 VITALS — BP 134/73 | HR 66 | Temp 98.4°F | Ht 62.0 in | Wt 139.0 lb

## 2023-02-04 DIAGNOSIS — E1165 Type 2 diabetes mellitus with hyperglycemia: Secondary | ICD-10-CM | POA: Diagnosis not present

## 2023-02-04 DIAGNOSIS — I10 Essential (primary) hypertension: Secondary | ICD-10-CM | POA: Diagnosis not present

## 2023-02-04 DIAGNOSIS — Z0100 Encounter for examination of eyes and vision without abnormal findings: Secondary | ICD-10-CM | POA: Diagnosis not present

## 2023-02-04 DIAGNOSIS — Z7984 Long term (current) use of oral hypoglycemic drugs: Secondary | ICD-10-CM

## 2023-02-04 DIAGNOSIS — E785 Hyperlipidemia, unspecified: Secondary | ICD-10-CM

## 2023-02-04 DIAGNOSIS — E119 Type 2 diabetes mellitus without complications: Secondary | ICD-10-CM

## 2023-02-04 MED ORDER — AMLODIPINE BESYLATE 10 MG PO TABS: 10.0000 mg | ORAL_TABLET | Freq: Every day | ORAL | 0 refills | Status: DC

## 2023-02-04 MED ORDER — LOSARTAN POTASSIUM 100 MG PO TABS: 100.0000 mg | ORAL_TABLET | Freq: Every day | ORAL | 0 refills | Status: DC

## 2023-02-04 MED ORDER — CARVEDILOL 3.125 MG PO TABS: 3.1250 mg | ORAL_TABLET | Freq: Two times a day (BID) | ORAL | 0 refills | Status: DC

## 2023-02-04 MED ORDER — ATORVASTATIN CALCIUM 20 MG PO TABS: 20.0000 mg | ORAL_TABLET | Freq: Every day | ORAL | 0 refills | Status: DC

## 2023-02-04 NOTE — Progress Notes (Signed)
Patient states no concerns to discuss

## 2023-02-04 NOTE — Progress Notes (Signed)
Patient ID: Carolyn Bowen, female    DOB: 1927-11-12  MRN: [id number]  CC: Chronic Conditions Follow-Up  Subjective: Carolyn Bowen is a 89 y.o. female who presents for chronic conditions follow-up.   Her concerns today include:  - Doing well on Amlodipine, Losartan, and Carvedilol, no issues/concerns. She does not complain of red flag symptoms such as but not limited to chest pain, shortness of breath, worst headache of life, nausea/vomiting.  - Home blood sugars 140's - 160's. She is trying to watch what she eats. She denies red flag symptoms associated with diabetes.    Patient Active Problem List   Diagnosis Date Noted   Unspecified atherosclerosis of native arteries of extremities, bilateral legs (HCC) 04/25/2022   Diabetic peripheral angiopathy (HCC) 04/03/2021   Hypercholesterolemia 11/09/2020   Hyperglycemia due to type 2 diabetes mellitus (HCC) 11/09/2020   Positive for microalbuminuria 02/25/2019   Lichen planus 02/24/2019   Anemia, chronic disease 12/30/2018   Chronic kidney disease, stage 2 (mild) 12/30/2018   Vaginal prolapse 11/11/2018   Controlled type 2 diabetes mellitus without complication, without long-term current use of insulin (HCC) 11/11/2018   Thyrotoxicosis 11/11/2018   Vaginal bleeding 11/11/2018   Hematuria, gross 11/11/2018   Essential hypertension 09/06/2014     Current Outpatient Medications on File Prior to Visit  Medication Sig Dispense Refill   aspirin 81 MG chewable tablet Chew 81 mg by mouth daily.     carvedilol (COREG) 3.125 MG tablet Take 1 tablet (3.125 mg total) by mouth 2 (two) times daily with a meal. 180 tablet 0   Cholecalciferol (VITAMIN D3) 50 MCG (2000 UT) capsule 1 capsule     CVS D3 50 MCG (2000 UT) CAPS Take 1 capsule by mouth daily.     esomeprazole (NEXIUM) 20 MG capsule Take 20 mg by mouth daily as needed (heartburn).     estradiol (ESTRACE) 0.1 MG/GM vaginal cream Place 1 g vaginally 2 (two) times a week.     fexofenadine  (ALLEGRA) 180 MG tablet Take 180 mg by mouth daily.     furosemide (LASIX) 40 MG tablet Take 1 tablet by mouth daily as needed.     glucose blood (PRODIGY NO CODING BLOOD GLUC) test strip 1 each by Other route as needed for other. Use as instructed 100 each 11   methimazole (TAPAZOLE) 5 MG tablet Take by mouth.     pseudoephedrine-guaifenesin (MUCINEX D) 60-600 MG 12 hr tablet Take 1 tablet by mouth every 12 (twelve) hours.     No current facility-administered medications on file prior to visit.    Allergies  Allergen Reactions   Codeine Other (See Comments)   Penicillin G Benzathine Other (See Comments)   Penicillins Hives   Amlodipine Besy-Benazepril Hcl Rash    Social History   Socioeconomic History   Marital status: Widowed    Spouse name: Not on file   Number of children: Not on file   Years of education: Not on file   Highest education level: Not on file  Occupational History   Not on file  Tobacco Use   Smoking status: Never    Passive exposure: Never   Smokeless tobacco: Never  Vaping Use   Vaping status: Never Used  Substance and Sexual Activity   Alcohol use: No    Alcohol/week: 0.0 standard drinks of alcohol   Drug use: No   Sexual activity: Not Currently  Other Topics Concern   Not on file  Social History Narrative  Not on file   Social Determinants of Health   Financial Resource Strain: Low Risk  (03/15/2022)   Overall Financial Resource Strain (CARDIA)    Difficulty of Paying Living Expenses: Not hard at all  Food Insecurity: No Food Insecurity (03/15/2022)   Hunger Vital Sign    Worried About Running Out of Food in the Last Year: Never true    Ran Out of Food in the Last Year: Never true  Transportation Needs: No Transportation Needs (03/15/2022)   PRAPARE - Administrator, Civil Service (Medical): No    Lack of Transportation (Non-Medical): No  Physical Activity: Inactive (03/15/2022)   Exercise Vital Sign    Days of Exercise per  Week: 0 days    Minutes of Exercise per Session: 0 min  Stress: No Stress Concern Present (03/15/2022)   [employer] of Occupational Health - Occupational Stress Questionnaire    Feeling of Stress : Not at all  Social Connections: Moderately Isolated (12/10/2020)   Social Connection and Isolation Panel [NHANES]    Frequency of Communication with Friends and Family: More than three times a week    Frequency of Social Gatherings with Friends and Family: Once a week    Attends Religious Services: 1 to 4 times per year    Active Member of [employer] or Organizations: No    Attends Banker Meetings: Never    Marital Status: Widowed  Catering manager Violence: Not on file    Family History  Problem Relation Age of Onset   Heart disease Mother    Cancer Father    Heart disease Brother    Breast cancer Daughter     Past Surgical History:  Procedure Laterality Date   ABDOMINAL HYSTERECTOMY     APPENDECTOMY     BREAST BIOPSY Right 2009   BREAST EXCISIONAL BIOPSY Right 2002   CATARACT EXTRACTION  2019   HERNIA REPAIR      ROS: Review of Systems Negative except as stated above  PHYSICAL EXAM: BP 134/73   Pulse 66   Temp 98.4 F (36.9 C) (Oral)   Ht 5\' 2"  (1.575 m)   Wt 139 lb (63 kg)   SpO2 97%   BMI 25.42 kg/m   Physical Exam HENT:     Head: Normocephalic and atraumatic.     Nose: Nose normal.     Mouth/Throat:     Mouth: Mucous membranes are moist.     Pharynx: Oropharynx is clear.  Eyes:     Extraocular Movements: Extraocular movements intact.     Conjunctiva/sclera: Conjunctivae normal.     Pupils: Pupils are equal, round, and reactive to light.  Cardiovascular:     Rate and Rhythm: Normal rate and regular rhythm.     Pulses: Normal pulses.     Heart sounds: Normal heart sounds.  Pulmonary:     Effort: Pulmonary effort is normal.     Breath sounds: Normal breath sounds.  Musculoskeletal:        General: Normal range of motion.     Cervical  back: Normal range of motion and neck supple.  Neurological:     General: No focal deficit present.     Mental Status: She is alert and oriented to person, place, and time.  Psychiatric:        Mood and Affect: Mood normal.        Behavior: Behavior normal.     ASSESSMENT AND PLAN: 1. Primary hypertension - Continue Amlodipine, Carvedilol,  and Losartan as prescribed.  - Counseled on blood pressure goal of less than 140/90, low-sodium, DASH diet, medication compliance, 150 minutes of moderate intensity exercise per week as tolerated. Discussed medication compliance, adverse effects. - Follow-up with primary provider in 3 months or sooner if needed.  - amLODipine (NORVASC) 10 MG tablet; Take 1 tablet (10 mg total) by mouth daily.  Dispense: 90 tablet; Refill: 0 - carvedilol (COREG) 3.125 MG tablet; Take 1 tablet (3.125 mg total) by mouth 2 (two) times daily with a meal.  Dispense: 180 tablet; Refill: 0 - losartan (COZAAR) 100 MG tablet; Take 1 tablet (100 mg total) by mouth daily.  Dispense: 90 tablet; Refill: 0  2. Type 2 diabetes mellitus with hyperglycemia, without long-term current use of insulin (HCC) - Patient currently practicing diet management for diabetes control. - Routine screening. - Discussed the importance of healthy eating habits, low-carbohydrate diet, low-sugar diet, and regular aerobic exercise (at least 150 minutes a week as tolerated) to achieve or maintain control of diabetes. - Follow-up with primary provider as scheduled. - Hemoglobin A1c  3. Diabetic eye exam [facility]) - Referral to Ophthalmology for evaluation/management. - Ambulatory referral to Ophthalmology  4. Encounter for diabetic foot exam [facility]) - Referral to Podiatry for evaluation/management. - Ambulatory referral to Podiatry  5. Hyperlipidemia, unspecified hyperlipidemia type - Continue Atorvastatin as prescribed. Counseled on medication adherence/adverse effects. - Routine screening.  - Follow-up  with primary provider as scheduled.  - atorvastatin (LIPITOR) 20 MG tablet; Take 1 tablet (20 mg total) by mouth daily.  Dispense: 90 tablet; Refill: 0 - Lipid panel    Patient was given the opportunity to ask questions.  Patient verbalized understanding of the plan and was able to repeat key elements of the plan. Patient was given clear instructions to go to Emergency Department or return to medical center if symptoms don't improve, worsen, or new problems develop.The patient verbalized understanding.   Orders Placed This Encounter  Procedures   Lipid panel   Hemoglobin A1c   Ambulatory referral to Podiatry   Ambulatory referral to Ophthalmology     Requested Prescriptions   Signed Prescriptions Disp Refills   amLODipine (NORVASC) 10 MG tablet 90 tablet 0    Sig: Take 1 tablet (10 mg total) by mouth daily.   atorvastatin (LIPITOR) 20 MG tablet 90 tablet 0    Sig: Take 1 tablet (20 mg total) by mouth daily.   carvedilol (COREG) 3.125 MG tablet 180 tablet 0    Sig: Take 1 tablet (3.125 mg total) by mouth 2 (two) times daily with a meal.   losartan (COZAAR) 100 MG tablet 90 tablet 0    Sig: Take 1 tablet (100 mg total) by mouth daily.    Return in about 3 months (around 05/07/2023) for Follow-Up or next available chronic conditions.  Rema Fendt, NP

## 2023-02-05 ENCOUNTER — Other Ambulatory Visit: Payer: Self-pay | Admitting: Family

## 2023-02-05 DIAGNOSIS — E1165 Type 2 diabetes mellitus with hyperglycemia: Secondary | ICD-10-CM

## 2023-02-05 LAB — LIPID PANEL
Chol/HDL Ratio: 2.5 ratio (ref 0.0–4.4)
Cholesterol, Total: 153 mg/dL (ref 100–199)
HDL: 61 mg/dL (ref >39)
LDL Chol Calc (NIH): 74 mg/dL (ref 0–99)
Triglycerides: 97 mg/dL (ref 0–149)
VLDL Cholesterol Cal: 18 mg/dL (ref 5–40)

## 2023-02-05 LAB — HEMOGLOBIN A1C
Est. average glucose Bld gHb Est-mCnc: 194 mg/dL
Hgb A1c MFr Bld: 8.4 % — ABNORMAL HIGH (ref 4.8–5.6)

## 2023-04-10 ENCOUNTER — Telehealth: Payer: Self-pay | Admitting: Family

## 2023-04-10 NOTE — Telephone Encounter (Signed)
Meds not on list, Tomma Lightning from [facility] called to request these for the patient.   Copied from CRM [id number]. Topic: Clinical - Medication Refill >> Apr 10, 2023  3:04 PM Franchot Heidelberg wrote: Most Recent Primary Care Visit:  Provider: Ricky Stabs J  Department: [facility]  Visit Type: OFFICE VISIT  Date: 02/04/2023  Medication: Acue Chek guide and True Matrix   + Meter and strips   Has the patient contacted their pharmacy? Yes (Agent: If no, request that the patient contact the pharmacy for the refill. If patient does not wish to contact the pharmacy document the reason why and proceed with request.) (Agent: If yes, when and what did the pharmacy advise?)  Is this the correct pharmacy for this prescription? Yes If no, delete pharmacy and type the correct one.  This is the patient's preferred pharmacy:   [facility] [address], [place] - [address] [place] [address] Phone: [phone] Fax: [phone]  Has the prescription been filled recently? Yes  Is the patient out of the medication? Yes  Has the patient been seen for an appointment in the last year OR does the patient have an upcoming appointment? Yes  Can we respond through MyChart? No.  Agent: Please be advised that Rx refills may take up to 3 business days. We ask that you follow-up with your pharmacy.

## 2023-04-12 ENCOUNTER — Other Ambulatory Visit: Payer: Self-pay | Admitting: Family Medicine

## 2023-04-12 MED ORDER — ACCU-CHEK GUIDE W/DEVICE KIT: PACK | 0 refills | Status: AC

## 2023-04-16 ENCOUNTER — Other Ambulatory Visit: Payer: Self-pay

## 2023-04-16 DIAGNOSIS — E1165 Type 2 diabetes mellitus with hyperglycemia: Secondary | ICD-10-CM

## 2023-04-16 MED ORDER — PRODIGY NO CODING BLOOD GLUC VI STRP: 1.0000 | ORAL_STRIP | 11 refills | Status: AC | PRN

## 2023-04-19 ENCOUNTER — Other Ambulatory Visit: Payer: Self-pay | Admitting: Family

## 2023-04-19 MED ORDER — ACCU-CHEK FASTCLIX LANCETS MISC: 4 refills | Status: AC

## 2023-04-19 NOTE — Telephone Encounter (Signed)
Copied from CRM [id number]. Topic: Clinical - Medication Refill >> Apr 19, 2023  2:44 PM Ivette P wrote: Most Recent Primary Care Visit:  Provider: Ricky Stabs J  [facility]: [facility]  Visit Type: OFFICE VISIT  Date: 02/04/2023  Medication: Lancets for Accu -Chek Guide Meter   Has the patient contacted their pharmacy? Yes (Agent: If no, request that the patient contact the pharmacy for the refill. If patient does not wish to contact the pharmacy document the reason why and proceed with request.) (Agent: If yes, when and what did the pharmacy advise?)  Is this the correct pharmacy for this prescription? Yes If no, delete pharmacy and type the correct one.  This is the patient's preferred pharmacy:  [facility] [address], [place] - [address] [place] [address] Phone: [phone] Fax: [phone]   Has the prescription been filled recently? No  Is the patient out of the medication? Yes  Has the patient been seen for an appointment in the last year OR does the patient have an upcoming appointment? Yes  Can we respond through MyChart? No  Agent: Please be advised that Rx refills may take up to 3 business days. We ask that you follow-up with your pharmacy.

## 2023-04-19 NOTE — Telephone Encounter (Signed)
Last Fill: Unk  Last OV: 02/04/23 Next OV: 05/08/23  Routing to provider for review/authorization.

## 2023-05-01 ENCOUNTER — Other Ambulatory Visit: Payer: Self-pay | Admitting: Family

## 2023-05-01 DIAGNOSIS — I10 Essential (primary) hypertension: Secondary | ICD-10-CM

## 2023-05-06 ENCOUNTER — Other Ambulatory Visit: Payer: Self-pay | Admitting: Family

## 2023-05-06 DIAGNOSIS — E785 Hyperlipidemia, unspecified: Secondary | ICD-10-CM

## 2023-05-07 ENCOUNTER — Other Ambulatory Visit: Payer: Self-pay | Admitting: Family

## 2023-05-07 DIAGNOSIS — I10 Essential (primary) hypertension: Secondary | ICD-10-CM

## 2023-05-07 NOTE — Telephone Encounter (Signed)
 Copied from CRM [id number]. Topic: Clinical - Medication Refill >> May 07, 2023  1:34 PM Maxwell Marion wrote: Most Recent Primary Care Visit:  Provider: Ricky Stabs J  [facility]: [facility]  Visit Type: OFFICE VISIT  Date: 02/04/2023  Medication: losartan (COZAAR) 100 MG tablet  Has the patient contacted their pharmacy? Pt has no refills (Agent: If no, request that the patient contact the pharmacy for the refill. If patient does not wish to contact the pharmacy document the reason why and proceed with request.) (Agent: If yes, when and what did the pharmacy advise?)  Is this the correct pharmacy for this prescription? Yes If no, delete pharmacy and type the correct one.  This is the patient's preferred pharmacy:  [facility] [address],  - [address] [place] [address] Phone: [phone] Fax: [phone]   Has the prescription been filled recently? No  Is the patient out of the medication? Yes  Has the patient been seen for an appointment in the last year OR does the patient have an upcoming appointment? Yes  Can we respond through MyChart? No  Agent: Please be advised that Rx refills may take up to 3 business days. We ask that you follow-up with your pharmacy.

## 2023-05-07 NOTE — Telephone Encounter (Signed)
 Requested medication (s) are due for refill today: Yes  Requested medication (s) are on the active medication list: Yes  Last refill:  02/04/23 #90, 0 refills  Future visit scheduled: Yes  Notes to clinic:  Unable to refill per protocol due to failed labs, no updated results.      Requested Prescriptions  Pending Prescriptions Disp Refills   losartan (COZAAR) 100 MG tablet 90 tablet 0    Sig: Take 1 tablet (100 mg total) by mouth daily.     Cardiovascular:  Angiotensin Receptor Blockers Failed - 05/07/2023  4:54 PM      Failed - Cr in normal range and within 180 days    Creatinine, Ser  Date Value Ref Range Status  05/10/2021 2.03 (H) 0.57 - 1.00 mg/dL Final         Failed - K in normal range and within 180 days    Potassium  Date Value Ref Range Status  05/10/2021 4.6 3.5 - 5.2 mmol/L Final  08/03/2019 5.0  Final         Passed - Patient is not pregnant      Passed - Last BP in normal range    BP Readings from Last 1 Encounters:  02/04/23 134/73         Passed - Valid encounter within last 6 months    Recent Outpatient Visits           3 months ago Primary hypertension   [place] [facility], Amy J, NP   9 months ago Controlled type 2 diabetes mellitus with stage 4 chronic kidney disease, without long-term current use of insulin (HCC)   [place] [facility], Amy J, NP   1 year ago Controlled type 2 diabetes mellitus with stage 4 chronic kidney disease, without long-term current use of insulin (HCC)   [place] [facility], [place], [place]   1 year ago Essential (primary) hypertension   [place] [facility], [place], [place]   1 year ago Essential (primary) hypertension   [place] [facility], [place], NP       Future Appointments             Tomorrow Rema Fendt, NP [facility] at [facility]

## 2023-05-07 NOTE — Telephone Encounter (Signed)
 Copied from CRM [id number]. Topic: Clinical - Medication Refill >> May 07, 2023  1:28 PM Maxwell Marion wrote: Most Recent Primary Care Visit:  Provider: Ricky Stabs J  [facility]: [facility]  Visit Type: OFFICE VISIT  Date: 02/04/2023  Medication: carvedilol (COREG) 3.125 MG tablet  Has the patient contacted their pharmacy? Yes (Agent: If no, request that the patient contact the pharmacy for the refill. If patient does not wish to contact the pharmacy document the reason why and proceed with request.) (Agent: If yes, when and what did the pharmacy advise?) Pharmacy says doctor's office haven't sent anything over yet  Is this the correct pharmacy for this prescription? Yes If no, delete pharmacy and type the correct one.  This is the patient's preferred pharmacy:  [facility] [address], [place] - [address] [place] [address] Phone: [phone] Fax: [phone]   Has the prescription been filled recently? No  Is the patient out of the medication? No  Has the patient been seen for an appointment in the last year OR does the patient have an upcoming appointment? Yes  Can we respond through MyChart? No  Agent: Please be advised that Rx refills may take up to 3 business days. We ask that you follow-up with your pharmacy.

## 2023-05-07 NOTE — Telephone Encounter (Signed)
 Requested medication (s) are due for refill today: Yes  Requested medication (s) are on the active medication list: Yes  Last refill:  02/04/23  Future visit scheduled: Yes  Notes to clinic:  Unable to refill per protocol, Rx expired.      Requested Prescriptions  Pending Prescriptions Disp Refills   carvedilol (COREG) 3.125 MG tablet 180 tablet 0    Sig: Take 1 tablet (3.125 mg total) by mouth 2 (two) times daily with a meal.     Cardiovascular: Beta Blockers 3 Failed - 05/07/2023  4:03 PM      Failed - Cr in normal range and within 360 days    Creatinine, Ser  Date Value Ref Range Status  05/10/2021 2.03 (H) 0.57 - 1.00 mg/dL Final         Failed - AST in normal range and within 360 days    AST  Date Value Ref Range Status  11/11/2018 19 0 - 40 IU/L Final         Failed - ALT in normal range and within 360 days    ALT  Date Value Ref Range Status  11/11/2018 18 0 - 32 IU/L Final         Passed - Last BP in normal range    BP Readings from Last 1 Encounters:  02/04/23 134/73         Passed - Last Heart Rate in normal range    Pulse Readings from Last 1 Encounters:  02/04/23 66         Passed - Valid encounter within last 6 months    Recent Outpatient Visits           3 months ago Primary hypertension   [place] [facility], Amy J, NP   9 months ago Controlled type 2 diabetes mellitus with stage 4 chronic kidney disease, without long-term current use of insulin (HCC)   [place] [facility], Amy J, NP   1 year ago Controlled type 2 diabetes mellitus with stage 4 chronic kidney disease, without long-term current use of insulin (HCC)   [place] [facility], [place], NP   1 year ago Essential (primary) hypertension   [place] [facility], [place], NP   1 year ago Essential (primary) hypertension   [place] [facility], [place], [place]       Future Appointments             Tomorrow Rema Fendt, NP [facility] [place] [facility] at [facility]

## 2023-05-08 ENCOUNTER — Encounter: Payer: Medicare Other | Admitting: Family

## 2023-05-08 MED ORDER — LOSARTAN POTASSIUM 100 MG PO TABS
100.0000 mg | ORAL_TABLET | Freq: Every day | ORAL | 0 refills | Status: DC
Start: 2023-05-08 — End: 2023-07-15

## 2023-05-08 MED ORDER — CARVEDILOL 3.125 MG PO TABS: 3.1250 mg | ORAL_TABLET | Freq: Two times a day (BID) | ORAL | 0 refills | Status: DC

## 2023-05-08 NOTE — Progress Notes (Signed)
 Erroneous encounter-disregard

## 2023-05-08 NOTE — Telephone Encounter (Signed)
 Complete

## 2023-05-15 ENCOUNTER — Other Ambulatory Visit: Payer: Self-pay | Admitting: Family

## 2023-05-15 DIAGNOSIS — Z1231 Encounter for screening mammogram for malignant neoplasm of breast: Secondary | ICD-10-CM

## 2023-05-29 ENCOUNTER — Telehealth: Payer: Self-pay

## 2023-05-29 NOTE — Telephone Encounter (Unsigned)
 Copied from CRM [id number]. Topic: General - Other >> May 29, 2023  2:00 PM Truddie Crumble wrote: Reason for CRM: erica from [facility] is calling stating she does not have any demographics for the patient CB [phone]

## 2023-06-12 ENCOUNTER — Ambulatory Visit: Payer: Medicare PPO | Admitting: Family

## 2023-06-13 ENCOUNTER — Ambulatory Visit (INDEPENDENT_AMBULATORY_CARE_PROVIDER_SITE_OTHER): Payer: Medicare Other

## 2023-06-13 DIAGNOSIS — Z0000 Encounter for general adult medical examination without abnormal findings: Secondary | ICD-10-CM | POA: Diagnosis not present

## 2023-06-13 NOTE — Patient Instructions (Signed)
 Carolyn Bowen , Thank you for taking time to come for your Medicare Wellness Visit. I appreciate your ongoing commitment to your health goals. Please review the following plan we discussed and let me know if I can assist you in the future.   Referrals/Orders/Follow-Ups/Clinician Recommendations: none  This is a list of the screening recommended for you and due dates:  Health Maintenance  Topic Date Due   Pneumonia Vaccine (1 of 2 - PCV) Never done   Eye exam for diabetics  Never done   Zoster (Shingles) Vaccine (1 of 2) Never done   Complete foot exam   08/26/2020   COVID-19 Vaccine (6 - 2024-25 season) 05/17/2023   Hemoglobin A1C  08/04/2023   Medicare Annual Wellness Visit  06/12/2024   DTaP/Tdap/Td vaccine (2 - Td or Tdap) 01/03/2031   Flu Shot  Completed   DEXA scan (bone density measurement)  Completed   HPV Vaccine  Aged Out    Advanced directives: (Copy Requested) Please bring a copy of your health care power of attorney and living will to the office to be added to your chart at your convenience. You can mail to [facility] [address] [place], [place] [address] or email to [email or web address]  Next Medicare Annual Wellness Visit scheduled for next year: Yes  insert Preventive Care attachment Insert FALL PREVENTION attachment if needed

## 2023-06-13 NOTE — Progress Notes (Addendum)
 Subjective:   Carolyn Bowen is a 89 y.o. who presents for a Medicare Wellness preventive visit.  Visit Complete: Virtual I connected with  Jerrol Banana on 06/13/23 by a audio enabled telemedicine application and verified that I am speaking with the correct person using two identifiers.  Patient Location: Home  Provider Location: Home Office  I discussed the limitations of evaluation and management by telemedicine. The patient expressed understanding and agreed to proceed.  Vital Signs: Because this visit was a virtual/telehealth visit, some criteria may be missing or patient reported. Any vitals not documented were not able to be obtained and vitals that have been documented are patient reported.  VideoError- Librarian, academic were attempted between this provider and patient, however failed, due to patient having technical difficulties OR patient did not have access to video capability.  We continued and completed visit with audio only.   Persons Participating in Visit: Patient.  AWV Questionnaire: No: Patient Medicare AWV questionnaire was not completed prior to this visit.  Cardiac Risk Factors include: advanced age (>52men, >60 women);diabetes mellitus;hypertension     Objective:    Today's Vitals   There is no height or weight on file to calculate BMI.     06/13/2023    1:48 PM 03/15/2022    2:00 PM 08/08/2015   10:44 AM  Advanced Directives  Does Patient Have a Medical Advance Directive? Yes Yes Yes  Type of Estate agent of [place];Living will Healthcare Power of [place];Living will   Copy of Healthcare Power of Attorney in Chart? No - copy requested No - copy requested     Current Medications (verified) Outpatient Encounter Medications as of 06/13/2023  Medication Sig   Accu-Chek FastClix Lancets MISC Use as instructed to check blood sugar two times a day   amLODipine (NORVASC) 10 MG tablet Take 1 tablet  (10 mg total) by mouth daily.   aspirin 81 MG chewable tablet Chew 81 mg by mouth daily.   atorvastatin (LIPITOR) 20 MG tablet TAKE 1 TABLET BY MOUTH EVERY DAY   Blood Glucose Monitoring Suppl (ACCU-CHEK GUIDE) w/Device KIT Utilize as directed to check blood glucose   carvedilol (COREG) 3.125 MG tablet Take 1 tablet (3.125 mg total) by mouth 2 (two) times daily with a meal.   Cholecalciferol (VITAMIN D3) 50 MCG (2000 UT) capsule 1 capsule   esomeprazole (NEXIUM) 20 MG capsule Take 20 mg by mouth daily as needed (heartburn).   estradiol (ESTRACE) 0.1 MG/GM vaginal cream Place 1 g vaginally 2 (two) times a week.   fexofenadine (ALLEGRA) 180 MG tablet Take 180 mg by mouth daily.   furosemide (LASIX) 40 MG tablet Take 1 tablet by mouth daily as needed.   glucose blood (PRODIGY NO CODING BLOOD GLUC) test strip 1 each by Other route as needed for other. Use as instructed   losartan (COZAAR) 100 MG tablet Take 1 tablet (100 mg total) by mouth daily.   methimazole (TAPAZOLE) 5 MG tablet Take by mouth.   pseudoephedrine-guaifenesin (MUCINEX D) 60-600 MG 12 hr tablet Take 1 tablet by mouth every 12 (twelve) hours.   carvedilol (COREG) 3.125 MG tablet Take 1 tablet (3.125 mg total) by mouth 2 (two) times daily with a meal.   CVS D3 50 MCG (2000 UT) CAPS Take 1 capsule by mouth daily.   No facility-administered encounter medications on file as of 06/13/2023.    Allergies (verified) Codeine, Penicillin g benzathine, Penicillins, and Amlodipine besy-benazepril hcl  History: Past Medical History:  Diagnosis Date   Abdominal pain    Diabetes mellitus without complication (HCC)    Hypertension    Thyroid disease    Past Surgical History:  Procedure Laterality Date   ABDOMINAL HYSTERECTOMY     APPENDECTOMY     BREAST BIOPSY Right 2009   BREAST EXCISIONAL BIOPSY Right 2002   CATARACT EXTRACTION  2019   HERNIA REPAIR     Family History  Problem Relation Age of Onset   Heart disease Mother     Cancer Father    Heart disease Brother    Breast cancer Daughter    Social History   Socioeconomic History   Marital status: Widowed    Spouse name: Not on file   Number of children: Not on file   Years of education: Not on file   Highest education level: Not on file  Occupational History   Not on file  Tobacco Use   Smoking status: Never    Passive exposure: Never   Smokeless tobacco: Never  Vaping Use   Vaping status: Never Used  Substance and Sexual Activity   Alcohol use: No    Alcohol/week: 0.0 standard drinks of alcohol   Drug use: No   Sexual activity: Not Currently  Other Topics Concern   Not on file  Social History Narrative   Not on file   Social Drivers of Health   Financial Resource Strain: Low Risk  (06/13/2023)   Overall Financial Resource Strain (CARDIA)    Difficulty of Paying Living Expenses: Not hard at all  Food Insecurity: No Food Insecurity (06/13/2023)   Hunger Vital Sign    Worried About Running Out of Food in the Last Year: Never true    Ran Out of Food in the Last Year: Never true  Transportation Needs: No Transportation Needs (06/13/2023)   PRAPARE - Administrator, Civil Service (Medical): No    Lack of Transportation (Non-Medical): No  Physical Activity: Inactive (06/13/2023)   Exercise Vital Sign    Days of Exercise per Week: 0 days    Minutes of Exercise per Session: 0 min  Stress: No Stress Concern Present (06/13/2023)   [employer] of Occupational Health - Occupational Stress Questionnaire    Feeling of Stress : Not at all  Social Connections: Moderately Isolated (06/13/2023)   Social Connection and Isolation Panel [NHANES]    Frequency of Communication with Friends and Family: More than three times a week    Frequency of Social Gatherings with Friends and Family: More than three times a week    Attends Religious Services: More than 4 times per year    Active Member of [employer] or Organizations: No    Attends Tax inspector Meetings: Never    Marital Status: Widowed    Tobacco Counseling Counseling given: Not Answered    Clinical Intake:  Pre-visit preparation completed: Yes  Pain : No/denies pain     Nutritional Risks: None Diabetes: No  Lab Results  Component Value Date   HGBA1C 8.4 (H) 02/04/2023   HGBA1C 7.4 (A) 08/01/2022   HGBA1C 7.6 (A) 05/02/2022     How often do you need to have someone help you when you read instructions, pamphlets, or other written materials from your doctor or pharmacy?: 1 - Never  Interpreter Needed?: No  Information entered by :: NAllen LPN   Activities of Daily Living     06/13/2023    1:43 PM  In  your present state of health, do you have any difficulty performing the following activities:  Hearing? 0  Vision? 0  Difficulty concentrating or making decisions? 0  Walking or climbing stairs? 0  Dressing or bathing? 0  Doing errands, shopping? 0  Preparing Food and eating ? N  Using the Toilet? N  In the past six months, have you accidently leaked urine? N  Do you have problems with loss of bowel control? N  Managing your Medications? N  Managing your Finances? N  Housekeeping or managing your Housekeeping? N    Patient Care Team: Rema Fendt, NP as PCP - General (Nurse Practitioner)  Indicate any recent Medical Services you may have received from other than [facility] providers in the past year (date may be approximate).     Assessment:   This is a routine wellness examination for Hyac[place].  Hearing/Vision screen Hearing Screening - Comments:: Denies hearing issues Vision Screening - Comments:: Regular eye exams, [facility]   Goals Addressed             This Visit's Progress    Patient Stated       06/13/2023, get out in garden       Depression Screen     06/13/2023    1:51 PM 02/04/2023   10:20 AM 08/01/2022    1:32 PM 05/02/2022    1:08 PM 03/15/2022    2:01 PM 10/30/2021    8:50 AM 05/10/2021    9:13 AM  PHQ 2/9  Scores  PHQ - 2 Score 0 0 0 0 0 0 0  PHQ- 9 Score 0          Fall Risk     06/13/2023    1:50 PM 02/04/2023   10:20 AM 08/01/2022    1:32 PM 05/02/2022    1:08 PM 03/15/2022    2:00 PM  Fall Risk   Falls in the past year? 0 0 0 0 0  Number falls in past yr: 0 0 0 0 0  Injury with Fall? 0 0 0 0 0  Risk for fall due to : Medication side effect;Impaired mobility;Impaired balance/gait No Fall Risks No Fall Risks No Fall Risks Medication side effect  Follow up Falls prevention discussed;Falls evaluation completed  Falls evaluation completed Falls evaluation completed Falls prevention discussed;Education provided;Falls evaluation completed    MEDICARE RISK AT HOME:  Medicare Risk at Home Any stairs in or around the home?: Yes If so, are there any without handrails?: No Home free of loose throw rugs in walkways, pet beds, electrical cords, etc?: Yes Adequate lighting in your home to reduce risk of falls?: Yes Life alert?: No Use of a cane, walker or w/c?: Yes Grab bars in the bathroom?: Yes Shower chair or bench in shower?: Yes Elevated toilet seat or a handicapped toilet?: No  TIMED UP AND GO:  Was the test performed?  No  Cognitive Function: 6CIT completed        06/13/2023    1:52 PM 03/15/2022    2:02 PM  6CIT Screen  What Year? 0 points 0 points  What month? 0 points 0 points  What time? 0 points 0 points  Count back from 20 0 points 0 points  Months in reverse 0 points 2 points  Repeat phrase 0 points 0 points  Total Score 0 points 2 points    Immunizations Immunization History  Administered Date(s) Administered   Fluad Quad(high Dose 65+) 12/04/2021   Influenza,inj,Quad PF,6+ Mos 11/11/2018  Influenza-Unspecified 12/18/2019, 11/14/2022   PFIZER(Purple Top)SARS-COV-2 Vaccination 04/09/2019, 04/30/2019, 12/14/2019   Pfizer Covid-19 Vaccine Bivalent Booster 27yrs & up 12/04/2021   Pfizer(Comirnaty)Fall Seasonal Vaccine 12 years and older 11/14/2022    RSV,unspecified 01/17/2022   Tdap 01/02/2021    Screening Tests Health Maintenance  Topic Date Due   Pneumonia Vaccine 75+ Years old (1 of 2 - PCV) Never done   OPHTHALMOLOGY EXAM  Never done   Zoster Vaccines- Shingrix (1 of 2) Never done   FOOT EXAM  08/26/2020   COVID-19 Vaccine (6 - 2024-25 season) 05/17/2023   HEMOGLOBIN A1C  08/04/2023   Medicare Annual Wellness (AWV)  06/12/2024   DTaP/Tdap/Td (2 - Td or Tdap) 01/03/2031   INFLUENZA VACCINE  Completed   DEXA SCAN  Completed   HPV VACCINES  Aged Out    Health Maintenance  Health Maintenance Due  Topic Date Due   Pneumonia Vaccine 78+ Years old (1 of 2 - PCV) Never done   OPHTHALMOLOGY EXAM  Never done   Zoster Vaccines- Shingrix (1 of 2) Never done   FOOT EXAM  08/26/2020   COVID-19 Vaccine (6 - 2024-25 season) 05/17/2023   Health Maintenance Items Addressed: Patient checking with daughter to see about shingrix and pneumonia vaccine.  Additional Screening:  Vision Screening: Recommended annual ophthalmology exams for early detection of glaucoma and other disorders of the eye.  Dental Screening: Recommended annual dental exams for proper oral hygiene  Community Resource Referral / Chronic Care Management: CRR required this visit?  No   CCM required this visit?  No     Plan:     I have personally reviewed and noted the following in the patient's chart:   Medical and social history Use of alcohol, tobacco or illicit drugs  Current medications and supplements including opioid prescriptions. Patient is not currently taking opioid prescriptions. Functional ability and status Nutritional status Physical activity Advanced directives List of other physicians Hospitalizations, surgeries, and ER visits in previous 12 months Vitals Screenings to include cognitive, depression, and falls Referrals and appointments  In addition, I have reviewed and discussed with patient certain preventive protocols, quality  metrics, and best practice recommendations. A written personalized care plan for preventive services as well as general preventive health recommendations were provided to patient.     Barb Merino, LPN   [id number]   After Visit Summary: (Pick Up) Due to this being a telephonic visit, with patients personalized plan was offered to patient and patient has requested to Pick up at office.  Notes: Nothing significant to report at this time.

## 2023-06-19 ENCOUNTER — Ambulatory Visit
Admission: RE | Admit: 2023-06-19 | Discharge: 2023-06-19 | Disposition: A | Discharge status: Home / Self Care | Payer: Medicare PPO | Source: Ambulatory Visit | Attending: Family | Admitting: Family

## 2023-06-19 DIAGNOSIS — Z1231 Encounter for screening mammogram for malignant neoplasm of breast: Secondary | ICD-10-CM

## 2023-06-24 ENCOUNTER — Ambulatory Visit (INDEPENDENT_AMBULATORY_CARE_PROVIDER_SITE_OTHER): Admitting: Primary Care

## 2023-07-13 ENCOUNTER — Other Ambulatory Visit: Payer: Self-pay | Admitting: Family

## 2023-07-13 DIAGNOSIS — I10 Essential (primary) hypertension: Secondary | ICD-10-CM

## 2023-07-13 DIAGNOSIS — E785 Hyperlipidemia, unspecified: Secondary | ICD-10-CM

## 2023-07-15 ENCOUNTER — Encounter: Payer: Self-pay | Admitting: Family

## 2023-07-15 ENCOUNTER — Ambulatory Visit (INDEPENDENT_AMBULATORY_CARE_PROVIDER_SITE_OTHER): Admitting: Family

## 2023-07-15 VITALS — BP 156/74 | HR 66 | Temp 97.8°F | Resp 16 | Ht 63.0 in | Wt 133.8 lb

## 2023-07-15 DIAGNOSIS — I10 Essential (primary) hypertension: Secondary | ICD-10-CM | POA: Diagnosis not present

## 2023-07-15 DIAGNOSIS — E785 Hyperlipidemia, unspecified: Secondary | ICD-10-CM | POA: Diagnosis not present

## 2023-07-15 MED ORDER — AMLODIPINE BESYLATE 10 MG PO TABS: 10.0000 mg | ORAL_TABLET | Freq: Every day | ORAL | 0 refills | Status: DC

## 2023-07-15 NOTE — Progress Notes (Signed)
 No concerns.

## 2023-07-15 NOTE — Progress Notes (Addendum)
 Patient ID: Carolyn Bowen, female    DOB: 12/12/1927  MRN: [id number]  CC: Chronic Conditions Follow-Up  Subjective: Carolyn Bowen is a 89 y.o. female who presents for chronic conditions follow-up.   Her concerns today include:  - Doing well on Amlodipine , Carvedilol , and Losartan , no issues/concerns. She does not complain of red flag symptoms such as but not limited to chest pain, shortness of breath, worst headache of life, nausea/vomiting. States she has an upcoming appointment with Cardiology on this week. - Doing well on Atorvastatin , no issues/concerns. States she has an upcoming appointment with Cardiology on this week.  Patient Active Problem List   Diagnosis Date Noted   Unspecified atherosclerosis of native arteries of extremities, bilateral legs (HCC) 04/25/2022   Diabetic peripheral angiopathy (HCC) 04/03/2021   Hypercholesterolemia 11/09/2020   Hyperglycemia due to type 2 diabetes mellitus (HCC) 11/09/2020   Positive for microalbuminuria 02/25/2019   Lichen planus 02/24/2019   Anemia, chronic disease 12/30/2018   Chronic kidney disease, stage 2 (mild) 12/30/2018   Vaginal prolapse 11/11/2018   Controlled type 2 diabetes mellitus without complication, without long-term current use of insulin (HCC) 11/11/2018   Thyrotoxicosis 11/11/2018   Vaginal bleeding 11/11/2018   Hematuria, gross 11/11/2018   Essential hypertension 09/06/2014     Current Outpatient Medications on File Prior to Visit  Medication Sig Dispense Refill   Accu-Chek FastClix Lancets MISC Use as instructed to check blood sugar two times a day 102 each 4   aspirin 81 MG chewable tablet Chew 81 mg by mouth daily.     atorvastatin  (LIPITOR) 20 MG tablet TAKE 1 TABLET BY MOUTH EVERY DAY 90 tablet 0   Blood Glucose Monitoring Suppl (ACCU-CHEK GUIDE) w/Device KIT Utilize as directed to check blood glucose 1 kit 0   carvedilol  (COREG ) 3.125 MG tablet TAKE 1 TABLET BY MOUTH TWICE A DAY WITH A MEAL 180 tablet 0    Cholecalciferol (VITAMIN D3) 50 MCG (2000 UT) capsule 1 capsule     [facility]S D3 50 MCG (2000 UT) CAPS Take 1 capsule by mouth daily.     esomeprazole (NEXIUM) 20 MG capsule Take 20 mg by mouth daily as needed (heartburn).     estradiol (ESTRACE) 0.1 MG/GM vaginal cream Place 1 g vaginally 2 (two) times a week.     fexofenadine (ALLEGRA) 180 MG tablet Take 180 mg by mouth daily.     furosemide (LASIX) 40 MG tablet Take 1 tablet by mouth daily as needed.     glucose blood (PRODIGY NO CODING BLOOD GLUC) test strip 1 each by Other route as needed for other. Use as instructed 100 each 11   losartan  (COZAAR ) 100 MG tablet TAKE 1 TABLET BY MOUTH EVERY DAY 90 tablet 0   methimazole (TAPAZOLE) 5 MG tablet Take by mouth.     pseudoephedrine-guaifenesin (MUCINEX D) 60-600 MG 12 hr tablet Take 1 tablet by mouth every 12 (twelve) hours.     carvedilol  (COREG ) 3.125 MG tablet Take 1 tablet (3.125 mg total) by mouth 2 (two) times daily with a meal. 180 tablet 0   No current facility-administered medications on file prior to visit.    Allergies  Allergen Reactions   Codeine Other (See Comments)   Penicillin G Benzathine Other (See Comments)   Penicillins Hives   Amlodipine  Besy-Benazepril Hcl Rash    Social History   Socioeconomic History   Marital status: Widowed    Spouse name: Not on file   Number of children: Not  on file   Years of education: Not on file   Highest education level: Not on file  Occupational History   Not on file  Tobacco Use   Smoking status: Never    Passive exposure: Never   Smokeless tobacco: Never  Vaping Use   Vaping status: Never Used  Substance and Sexual Activity   Alcohol use: No    Alcohol/week: 0.0 standard drinks of alcohol   Drug use: No   Sexual activity: Not Currently  Other Topics Concern   Not on file  Social History Narrative   Not on file   Social Drivers of Health   Financial Resource Strain: Low Risk  (06/13/2023)   Overall Financial Resource  Strain (CARDIA)    Difficulty of Paying Living Expenses: Not hard at all  Food Insecurity: No Food Insecurity (06/13/2023)   Hunger Vital Sign    Worried About Running Out of Food in the Last Year: Never true    Ran Out of Food in the Last Year: Never true  Transportation Needs: No Transportation Needs (06/13/2023)   PRAPARE - Administrator, Civil Service (Medical): No    Lack of Transportation (Non-Medical): No  Physical Activity: Inactive (06/13/2023)   Exercise Vital Sign    Days of Exercise per Week: 0 days    Minutes of Exercise per Session: 0 min  Stress: No Stress Concern Present (06/13/2023)   [employer] of Occupational Health - Occupational Stress Questionnaire    Feeling of Stress : Not at all  Social Connections: Moderately Isolated (06/13/2023)   Social Connection and Isolation Panel [NHANES]    Frequency of Communication with Friends and Family: More than three times a week    Frequency of Social Gatherings with Friends and Family: More than three times a week    Attends Religious Services: More than 4 times per year    Active Member of [employer] or Organizations: No    Attends Banker Meetings: Never    Marital Status: Widowed  Intimate Partner Violence: Not At Risk (06/13/2023)   Humiliation, Afraid, Rape, and Kick questionnaire    Fear of Current or Ex-Partner: No    Emotionally Abused: No    Physically Abused: No    Sexually Abused: No    Family History  Problem Relation Age of Onset   Heart disease Mother    Cancer Father    Heart disease Brother    Breast cancer Daughter     Past Surgical History:  Procedure Laterality Date   ABDOMINAL HYSTERECTOMY     APPENDECTOMY     BREAST BIOPSY Right 2009   BREAST EXCISIONAL BIOPSY Right 2002   CATARACT EXTRACTION  2019   HERNIA REPAIR      ROS: Review of Systems Negative except as stated above  PHYSICAL EXAM: BP (!) 156/74   Pulse 66   Temp 97.8 F (36.6 C) (Oral)   Resp 16    Ht 5\' 3"  (1.6 m)   Wt 133 lb 12.8 oz (60.7 kg)   SpO2 96%   BMI 23.70 kg/m   Physical Exam HENT:     Head: Normocephalic and atraumatic.     Nose: Nose normal.     Mouth/Throat:     Mouth: Mucous membranes are moist.     Pharynx: Oropharynx is clear.  Eyes:     Extraocular Movements: Extraocular movements intact.     Conjunctiva/sclera: Conjunctivae normal.     Pupils: Pupils are equal, round, and  reactive to light.  Cardiovascular:     Rate and Rhythm: Normal rate and regular rhythm.     Pulses: Normal pulses.     Heart sounds: Normal heart sounds.  Pulmonary:     Effort: Pulmonary effort is normal.     Breath sounds: Normal breath sounds.  Musculoskeletal:        General: Normal range of motion.     Cervical back: Normal range of motion and neck supple.  Neurological:     General: No focal deficit present.     Mental Status: She is alert and oriented to person, place, and time.  Psychiatric:        Mood and Affect: Mood normal.        Behavior: Behavior normal.    ASSESSMENT AND PLAN: 1. Primary hypertension (Primary) - Blood pressure not at goal during today's visit. Patient asymptomatic without chest pressure, chest pain, palpitations, shortness of breath, worst headache of life, and any additional red flag symptoms. - Continue Losartan  and Carvedilol  as prescribed. No refills needed as of present (last prescribed 07/15/2023). - Continue Amlodipine  as prescribed.  - Routine screening.  - Counseled on blood pressure goal of less than 140/90, low-sodium, DASH diet, medication compliance, 150 minutes of moderate intensity exercise per week as tolerated. Discussed medication compliance, adverse effects. - Keep all scheduled appointments with Cardiology. - Follow-up with primary provider as scheduled. - amLODipine  (NORVASC ) 10 MG tablet; Take 1 tablet (10 mg total) by mouth daily.  Dispense: 90 tablet; Refill: 0 - Basic Metabolic Panel  2. Hyperlipidemia, unspecified  hyperlipidemia type - Continue Atorvastatin  as prescribed. No refills needed as of present (last prescribed 07/15/2023). - Keep all scheduled appointments with Cardiology.  - Follow-up with primary provider as scheduled.   Patient was given the opportunity to ask questions.  Patient verbalized understanding of the plan and was able to repeat key elements of the plan. Patient was given clear instructions to go to Emergency Department or return to medical center if symptoms don't improve, worsen, or new problems develop.The patient verbalized understanding.   Orders Placed This Encounter  Procedures   Basic Metabolic Panel     Requested Prescriptions   Signed Prescriptions Disp Refills   amLODipine  (NORVASC ) 10 MG tablet 90 tablet 0    Sig: Take 1 tablet (10 mg total) by mouth daily.    Follow-up with primary provider as scheduled.  Senaida Dama, NP

## 2023-07-16 LAB — BASIC METABOLIC PANEL WITH GFR
BUN/Creatinine Ratio: 19 (ref 12–28)
BUN: 36 mg/dL (ref 10–36)
CO2: 21 mmol/L (ref 20–29)
Calcium: 9.5 mg/dL (ref 8.7–10.3)
Chloride: 104 mmol/L (ref 96–106)
Creatinine, Ser: 1.88 mg/dL — ABNORMAL HIGH (ref 0.57–1.00)
Glucose: 142 mg/dL — ABNORMAL HIGH (ref 70–99)
Potassium: 4.5 mmol/L (ref 3.5–5.2)
Sodium: 142 mmol/L (ref 134–144)
eGFR: 25 mL/min/{1.73_m2} — ABNORMAL LOW (ref >59)

## 2023-08-28 DIAGNOSIS — Z4689 Encounter for fitting and adjustment of other specified devices: Secondary | ICD-10-CM | POA: Diagnosis not present

## 2023-10-03 ENCOUNTER — Other Ambulatory Visit: Payer: Self-pay | Admitting: Family

## 2023-10-03 DIAGNOSIS — I10 Essential (primary) hypertension: Secondary | ICD-10-CM

## 2023-10-03 DIAGNOSIS — E785 Hyperlipidemia, unspecified: Secondary | ICD-10-CM

## 2023-10-04 ENCOUNTER — Telehealth: Payer: Self-pay | Admitting: Family

## 2023-10-04 NOTE — Telephone Encounter (Signed)
 Patient was identified as falling into the [place] Measure - Diabetes.   Patient was: Appointment already scheduled for:  10/14/23.

## 2023-10-09 ENCOUNTER — Other Ambulatory Visit: Payer: Self-pay | Admitting: Pharmacist

## 2023-10-09 DIAGNOSIS — I10 Essential (primary) hypertension: Secondary | ICD-10-CM | POA: Diagnosis not present

## 2023-10-09 DIAGNOSIS — E1122 Type 2 diabetes mellitus with diabetic chronic kidney disease: Secondary | ICD-10-CM | POA: Diagnosis not present

## 2023-10-09 DIAGNOSIS — E782 Mixed hyperlipidemia: Secondary | ICD-10-CM | POA: Diagnosis not present

## 2023-10-09 DIAGNOSIS — I739 Peripheral vascular disease, unspecified: Secondary | ICD-10-CM | POA: Diagnosis not present

## 2023-10-09 NOTE — Progress Notes (Signed)
 Pharmacy Quality Measure Review  This patient is appearing on a report for being at risk of failing the adherence measure for diabetes medications this calendar year.   Medication: linagliptin Last fill date: 10/06/2023 for 30 day supply  Insurance report was not up to date. No action needed at this time.  Reminder set for next month's fill.   Herlene Fleeta Morris, PharmD, JAQUELINE, CPP Clinical Pharmacist [facility] & [facility] [phone]

## 2023-10-14 ENCOUNTER — Ambulatory Visit (INDEPENDENT_AMBULATORY_CARE_PROVIDER_SITE_OTHER): Admitting: Family

## 2023-10-14 ENCOUNTER — Ambulatory Visit: Payer: Self-pay | Admitting: Family

## 2023-10-14 ENCOUNTER — Encounter: Payer: Self-pay | Admitting: Family

## 2023-10-14 VITALS — BP 138/77 | HR 59 | Temp 98.1°F | Resp 16 | Ht 63.0 in | Wt 132.0 lb

## 2023-10-14 DIAGNOSIS — E119 Type 2 diabetes mellitus without complications: Secondary | ICD-10-CM

## 2023-10-14 DIAGNOSIS — I10 Essential (primary) hypertension: Secondary | ICD-10-CM | POA: Diagnosis not present

## 2023-10-14 DIAGNOSIS — E1165 Type 2 diabetes mellitus with hyperglycemia: Secondary | ICD-10-CM | POA: Diagnosis not present

## 2023-10-14 DIAGNOSIS — E785 Hyperlipidemia, unspecified: Secondary | ICD-10-CM | POA: Diagnosis not present

## 2023-10-14 LAB — POCT GLYCOSYLATED HEMOGLOBIN (HGB A1C): Hemoglobin A1C: 6.8 % — AB (ref 4.0–5.6)

## 2023-10-14 MED ORDER — ATORVASTATIN CALCIUM 20 MG PO TABS: 20.0000 mg | ORAL_TABLET | Freq: Every day | ORAL | 0 refills | Status: DC

## 2023-10-14 MED ORDER — AMLODIPINE BESYLATE 10 MG PO TABS: 10.0000 mg | ORAL_TABLET | Freq: Every day | ORAL | 0 refills | Status: DC

## 2023-10-14 MED ORDER — CARVEDILOL 3.125 MG PO TABS: 3.1250 mg | ORAL_TABLET | Freq: Two times a day (BID) | ORAL | 0 refills | Status: AC

## 2023-10-14 MED ORDER — LOSARTAN POTASSIUM 100 MG PO TABS: 100.0000 mg | ORAL_TABLET | Freq: Every day | ORAL | 0 refills | Status: DC

## 2023-10-14 NOTE — Progress Notes (Signed)
 3 month follow up, A1C checked

## 2023-10-14 NOTE — Progress Notes (Signed)
 Patient ID: Carolyn Bowen, female    DOB: [birth year]  MRN: [id number]  CC: Chronic Conditions Follow-Up  Subjective: Carolyn Bowen is a 89 y.o. female who presents for chronic conditions follow-up.   Her concerns today include:  - Doing well on Amlodipine , Losartan , and Carvedilol , no issues/concerns. She does not complain of red flag symptoms such as but not limited to chest pain, shortness of breath, worst headache of life, nausea/vomiting.  - Patient last seen on 01/23/2023 by specialist for type 2 diabetes mellitus with hyperglycemia, thyrotoxicosis, and additional diagnoses. States she is doing well on medication regimen, no issues/concerns.  - Due for diabetic eye exam. - Due for diabetic foot exam. - Doing well on Atorvastatin , no issues/concerns. - Patient confirms she is established with Nephrology.   Patient Active Problem List   Diagnosis Date Noted   Unspecified atherosclerosis of native arteries of extremities, bilateral legs (HCC) 04/25/2022   Diabetic peripheral angiopathy (HCC) 04/03/2021   Hypercholesterolemia 11/09/2020   Hyperglycemia due to type 2 diabetes mellitus (HCC) 11/09/2020   Positive for microalbuminuria 02/25/2019   Lichen planus 02/24/2019   Anemia, chronic disease 12/30/2018   Chronic kidney disease, stage 2 (mild) 12/30/2018   Vaginal prolapse 11/11/2018   Controlled type 2 diabetes mellitus without complication, without long-term current use of insulin (HCC) 11/11/2018   Thyrotoxicosis 11/11/2018   Vaginal bleeding 11/11/2018   Hematuria, gross 11/11/2018   Essential hypertension 09/06/2014     Current Outpatient Medications on File Prior to Visit  Medication Sig Dispense Refill   Accu-Chek FastClix Lancets MISC Use as instructed to check blood sugar two times a day 102 each 4   aspirin 81 MG chewable tablet Chew 81 mg by mouth daily.     Blood Glucose Monitoring Suppl (ACCU-CHEK GUIDE) w/Device KIT Utilize as directed to check blood glucose  1 kit 0   carvedilol  (COREG ) 3.125 MG tablet TAKE 1 TABLET BY MOUTH TWICE A DAY WITH FOOD 180 tablet 0   Cholecalciferol (VITAMIN D3) 50 MCG (2000 UT) capsule 1 capsule     CVS D3 50 MCG (2000 UT) CAPS Take 1 capsule by mouth daily.     esomeprazole (NEXIUM) 20 MG capsule Take 20 mg by mouth daily as needed (heartburn).     estradiol (ESTRACE) 0.1 MG/GM vaginal cream Place 1 g vaginally 2 (two) times a week.     fexofenadine (ALLEGRA) 180 MG tablet Take 180 mg by mouth daily.     furosemide (LASIX) 40 MG tablet Take 1 tablet by mouth daily as needed.     glucose blood (PRODIGY NO CODING BLOOD GLUC) test strip 1 each by Other route as needed for other. Use as instructed 100 each 11   methimazole (TAPAZOLE) 5 MG tablet Take by mouth.     pseudoephedrine-guaifenesin (MUCINEX D) 60-600 MG 12 hr tablet Take 1 tablet by mouth every 12 (twelve) hours.     No current facility-administered medications on file prior to visit.    Allergies  Allergen Reactions   Codeine Other (See Comments)   Penicillin G Benzathine Other (See Comments)   Penicillins Hives   Amlodipine  Besy-Benazepril Hcl Rash    Social History   Socioeconomic History   Marital status: Widowed    Spouse name: Not on file   Number of children: Not on file   Years of education: Not on file   Highest education level: Not on file  Occupational History   Not on file  Tobacco Use  Smoking status: Never    Passive exposure: Never   Smokeless tobacco: Never  Vaping Use   Vaping status: Never Used  Substance and Sexual Activity   Alcohol use: No    Alcohol/week: 0.0 standard drinks of alcohol   Drug use: No   Sexual activity: Not Currently  Other Topics Concern   Not on file  Social History Narrative   Not on file   Social Drivers of Health   Financial Resource Strain: Low Risk  (06/13/2023)   Overall Financial Resource Strain (CARDIA)    Difficulty of Paying Living Expenses: Not hard at all  Food Insecurity: Low  Risk  (08/28/2023)   Received from [facility]   Hunger Vital Sign    Within the past 12 months, you worried that your food would run out before you got money to buy more: Never true    Within the past 12 months, the food you bought just didn't last and you didn't have money to get more. : Never true  Transportation Needs: No Transportation Needs (08/28/2023)   Received from [facility]    In the past 12 months, has lack of reliable transportation kept you from medical appointments, meetings, work or from getting things needed for daily living? : No  Physical Activity: Inactive (06/13/2023)   Exercise Vital Sign    Days of Exercise per Week: 0 days    Minutes of Exercise per Session: 0 min  Stress: No Stress Concern Present (06/13/2023)   [employer] of Occupational Health - Occupational Stress Questionnaire    Feeling of Stress : Not at all  Social Connections: Moderately Isolated (06/13/2023)   Social Connection and Isolation Panel    Frequency of Communication with Friends and Family: More than three times a week    Frequency of Social Gatherings with Friends and Family: More than three times a week    Attends Religious Services: More than 4 times per year    Active Member of [employer] or Organizations: No    Attends Banker Meetings: Never    Marital Status: Widowed  Intimate Partner Violence: Not At Risk (06/13/2023)   Humiliation, Afraid, Rape, and Kick questionnaire    Fear of Current or Ex-Partner: No    Emotionally Abused: No    Physically Abused: No    Sexually Abused: No    Family History  Problem Relation Age of Onset   Heart disease Mother    Cancer Father    Heart disease Brother    Breast cancer Daughter     Past Surgical History:  Procedure Laterality Date   ABDOMINAL HYSTERECTOMY     APPENDECTOMY     BREAST BIOPSY Right 2009   BREAST EXCISIONAL BIOPSY Right 2002   CATARACT EXTRACTION  2019   HERNIA REPAIR       ROS: Review of Systems Negative except as stated above  PHYSICAL EXAM: BP 138/77   Pulse (!) 59   Temp 98.1 F (36.7 C) (Oral)   Resp 16   Ht 5' 3 (1.6 m)   Wt 132 lb (59.9 kg)   SpO2 98%   BMI 23.38 kg/m   Physical Exam HENT:     Head: Normocephalic and atraumatic.     Nose: Nose normal.     Mouth/Throat:     Mouth: Mucous membranes are moist.     Pharynx: Oropharynx is clear.  Eyes:     Extraocular Movements: Extraocular movements intact.  Conjunctiva/sclera: Conjunctivae normal.     Pupils: Pupils are equal, round, and reactive to light.  Cardiovascular:     Rate and Rhythm: Bradycardia present.     Pulses: Normal pulses.     Heart sounds: Normal heart sounds.  Pulmonary:     Effort: Pulmonary effort is normal.     Breath sounds: Normal breath sounds.  Musculoskeletal:        General: Normal range of motion.     Cervical back: Normal range of motion and neck supple.  Neurological:     General: No focal deficit present.     Mental Status: She is alert and oriented to person, place, and time.  Psychiatric:        Mood and Affect: Mood normal.        Behavior: Behavior normal.    ASSESSMENT AND PLAN: 1. Primary hypertension (Primary) - Continue Amlodipine , Losartan , and Carvedilol  as prescribed.  - Counseled on blood pressure goal of less than 130/80, low-sodium, DASH diet, medication compliance, and 150 minutes of moderate intensity exercise per week as tolerated. Counseled on medication adherence and adverse effects. - Follow-up with primary provider in 3 months or sooner if needed. - amLODipine  (NORVASC ) 10 MG tablet; Take 1 tablet (10 mg total) by mouth daily.  Dispense: 90 tablet; Refill: 0 - carvedilol  (COREG ) 3.125 MG tablet; Take 1 tablet (3.125 mg total) by mouth 2 (two) times daily with a meal.  Dispense: 180 tablet; Refill: 0 - losartan  (COZAAR ) 100 MG tablet; Take 1 tablet (100 mg total) by mouth daily.  Dispense: 90 tablet; Refill: 0  2.  Type 2 diabetes mellitus with hyperglycemia, without long-term current use of insulin (HCC) - Hemoglobin A1c at goal today a 6.8%. Goal 7%. - Continue present management as managed by specialist.  - Discussed the importance of healthy eating habits, low-carbohydrate diet, low-sugar diet, regular aerobic exercise (at least 150 minutes a week as tolerated) and medication compliance to achieve or maintain control of diabetes. Counseled on medication adherence/adverse effects.  - Keep all scheduled appointments with specialist. - Follow-up with primary provider as scheduled.  - HgB A1c  3. Diabetic eye exam [facility]) - Referral to Ophthalmology for evaluation/management. - Ambulatory referral to Ophthalmology  4. Encounter for diabetic foot exam [facility]) - Referral to Podiatry for evaluation/management.  - Ambulatory referral to Podiatry  5. Hyperlipidemia, unspecified hyperlipidemia type - Continue Atorvastatin  as prescribed. Counseled on medication adherence/adverse effects. - Follow-up with primary provider in 3 months or sooner if needed.  - atorvastatin  (LIPITOR) 20 MG tablet; Take 1 tablet (20 mg total) by mouth daily.  Dispense: 90 tablet; Refill: 0   Patient was given the opportunity to ask questions.  Patient verbalized understanding of the plan and was able to repeat key elements of the plan. Patient was given clear instructions to go to Emergency Department or return to medical center if symptoms don't improve, worsen, or new problems develop.The patient verbalized understanding.   Orders Placed This Encounter  Procedures   Ambulatory referral to Ophthalmology   Ambulatory referral to Podiatry   HgB A1c     Requested Prescriptions   Signed Prescriptions Disp Refills   amLODipine  (NORVASC ) 10 MG tablet 90 tablet 0    Sig: Take 1 tablet (10 mg total) by mouth daily.   carvedilol  (COREG ) 3.125 MG tablet 180 tablet 0    Sig: Take 1 tablet (3.125 mg total) by mouth 2 (two) times  daily with a meal.   losartan  (COZAAR )  100 MG tablet 90 tablet 0    Sig: Take 1 tablet (100 mg total) by mouth daily.   atorvastatin  (LIPITOR) 20 MG tablet 90 tablet 0    Sig: Take 1 tablet (20 mg total) by mouth daily.    Follow-up with primary provider as scheduled.   Greig JINNY Drones, NP

## 2023-11-08 ENCOUNTER — Other Ambulatory Visit (HOSPITAL_BASED_OUTPATIENT_CLINIC_OR_DEPARTMENT_OTHER): Payer: Self-pay | Admitting: Pharmacist

## 2023-11-08 DIAGNOSIS — E119 Type 2 diabetes mellitus without complications: Secondary | ICD-10-CM

## 2023-11-08 NOTE — Progress Notes (Signed)
 Pharmacy Quality Measure Review  This patient is appearing on a report for being at risk of failing the adherence measure for diabetes medications this calendar year.   Medication: linagliptin Last fill date: 11/04/2023 for 30 day supply  Insurance report was not up to date. No action needed at this time.  Reminder set for next month's fill.   Herlene Fleeta Morris, PharmD, JAQUELINE, CPP Clinical Pharmacist [facility] & [facility] [phone]

## 2023-12-02 DIAGNOSIS — E1165 Type 2 diabetes mellitus with hyperglycemia: Secondary | ICD-10-CM | POA: Diagnosis not present

## 2023-12-02 LAB — HEMOGLOBIN A1C: Hemoglobin A1C: 6.5

## 2023-12-09 DIAGNOSIS — E7800 Pure hypercholesterolemia, unspecified: Secondary | ICD-10-CM | POA: Diagnosis not present

## 2023-12-09 DIAGNOSIS — I10 Essential (primary) hypertension: Secondary | ICD-10-CM | POA: Diagnosis not present

## 2023-12-09 DIAGNOSIS — E0590 Thyrotoxicosis, unspecified without thyrotoxic crisis or storm: Secondary | ICD-10-CM | POA: Diagnosis not present

## 2023-12-09 DIAGNOSIS — N182 Chronic kidney disease, stage 2 (mild): Secondary | ICD-10-CM | POA: Diagnosis not present

## 2023-12-09 DIAGNOSIS — E1165 Type 2 diabetes mellitus with hyperglycemia: Secondary | ICD-10-CM | POA: Diagnosis not present

## 2023-12-11 ENCOUNTER — Encounter: Payer: Self-pay | Admitting: *Deleted

## 2023-12-11 ENCOUNTER — Other Ambulatory Visit (HOSPITAL_BASED_OUTPATIENT_CLINIC_OR_DEPARTMENT_OTHER): Payer: Self-pay | Admitting: Pharmacist

## 2023-12-11 DIAGNOSIS — E119 Type 2 diabetes mellitus without complications: Secondary | ICD-10-CM

## 2023-12-11 NOTE — Progress Notes (Signed)
 Pharmacy Quality Measure Review  This patient is appearing on a report for being at risk of failing the adherence measure for diabetes medications this calendar year.   Medication: linagliptin Last fill date: 11/02/2023 for 90 day supply  Insurance report was not up to date. No action needed at this time.    Of note, recently had A1c resulted at [facility] A1c was 6.5. I abstracted this result and entered this in her chart.   Herlene Fleeta Morris, PharmD, JAQUELINE, CPP Clinical Pharmacist [facility] & [facility] [phone]

## 2024-01-08 DIAGNOSIS — E1122 Type 2 diabetes mellitus with diabetic chronic kidney disease: Secondary | ICD-10-CM | POA: Diagnosis not present

## 2024-01-08 DIAGNOSIS — I739 Peripheral vascular disease, unspecified: Secondary | ICD-10-CM | POA: Diagnosis not present

## 2024-01-08 DIAGNOSIS — N184 Chronic kidney disease, stage 4 (severe): Secondary | ICD-10-CM | POA: Diagnosis not present

## 2024-01-08 DIAGNOSIS — I10 Essential (primary) hypertension: Secondary | ICD-10-CM | POA: Diagnosis not present

## 2024-01-13 ENCOUNTER — Ambulatory Visit: Admitting: Family

## 2024-01-13 ENCOUNTER — Encounter: Payer: Self-pay | Admitting: Family

## 2024-01-13 VITALS — BP 138/72 | HR 66 | Temp 98.0°F | Resp 16 | Ht 63.0 in | Wt 131.8 lb

## 2024-01-13 DIAGNOSIS — I10 Essential (primary) hypertension: Secondary | ICD-10-CM | POA: Diagnosis not present

## 2024-01-13 DIAGNOSIS — E119 Type 2 diabetes mellitus without complications: Secondary | ICD-10-CM

## 2024-01-13 DIAGNOSIS — R0989 Other specified symptoms and signs involving the circulatory and respiratory systems: Secondary | ICD-10-CM

## 2024-01-13 DIAGNOSIS — E785 Hyperlipidemia, unspecified: Secondary | ICD-10-CM

## 2024-01-13 DIAGNOSIS — Z0189 Encounter for other specified special examinations: Secondary | ICD-10-CM

## 2024-01-13 DIAGNOSIS — E1165 Type 2 diabetes mellitus with hyperglycemia: Secondary | ICD-10-CM

## 2024-01-13 MED ORDER — AMLODIPINE BESYLATE 10 MG PO TABS: 10.0000 mg | ORAL_TABLET | Freq: Every day | ORAL | 0 refills | Status: AC

## 2024-01-13 MED ORDER — ATORVASTATIN CALCIUM 20 MG PO TABS: 20.0000 mg | ORAL_TABLET | Freq: Every day | ORAL | 0 refills | Status: AC

## 2024-01-13 MED ORDER — CARVEDILOL 3.125 MG PO TABS: 3.1250 mg | ORAL_TABLET | Freq: Two times a day (BID) | ORAL | 0 refills | Status: AC

## 2024-01-13 MED ORDER — LOSARTAN POTASSIUM 100 MG PO TABS: 100.0000 mg | ORAL_TABLET | Freq: Every day | ORAL | 0 refills | Status: AC

## 2024-01-13 NOTE — Progress Notes (Signed)
 Patient ID: Carolyn Bowen, female    DOB: Jun 02, 1927  MRN: [id number]  CC: Chronic Conditions Follow-Up  Subjective: Carolyn Bowen is a 89 y.o. female who presents for chronic conditions follow-up.  Her concerns today include:  - Doing well on Amlodipine , Losartan , and Carvedilol , no issues/concerns. Established with Cardiology. She does not complain of red flag symptoms such as but not limited to chest pain, shortness of breath, worst headache of life, nausea/vomiting.  - States she was recently seen by a specialist who prescribed her a medication to help with diabetes. She cannot recall the name of the medication she was prescribed. Last hemoglobin A1c 6.5% on 12/02/2023. Denies red flag symptoms associated with diabetes.  - Due for diabetic eye exam.  - Due for diabetic foot exam. - Doing well on Atorvastatin , no issues/concerns. - States increased mucus of throat. Denies red flag symptoms. Taking over-the-counter medication with some improvement.  - Established with Nephrology.   Patient Active Problem List   Diagnosis Date Noted   Unspecified atherosclerosis of native arteries of extremities, bilateral legs 04/25/2022   Diabetic peripheral angiopathy (HCC) 04/03/2021   Hypercholesterolemia 11/09/2020   Hyperglycemia due to type 2 diabetes mellitus (HCC) 11/09/2020   Positive for microalbuminuria 02/25/2019   Lichen planus 02/24/2019   Anemia, chronic disease 12/30/2018   Chronic kidney disease, stage 2 (mild) 12/30/2018   Vaginal prolapse 11/11/2018   Controlled type 2 diabetes mellitus without complication, without long-term current use of insulin (HCC) 11/11/2018   Thyrotoxicosis 11/11/2018   Vaginal bleeding 11/11/2018   Hematuria, gross 11/11/2018   Essential hypertension 09/06/2014     Current Outpatient Medications on File Prior to Visit  Medication Sig Dispense Refill   Accu-Chek FastClix Lancets MISC Use as instructed to check blood sugar two times a day 102 each  4   aspirin 81 MG chewable tablet Chew 81 mg by mouth daily.     Blood Glucose Monitoring Suppl (ACCU-CHEK GUIDE) w/Device KIT Utilize as directed to check blood glucose 1 kit 0   carvedilol  (COREG ) 3.125 MG tablet Take 1 tablet (3.125 mg total) by mouth 2 (two) times daily with a meal. 180 tablet 0   Cholecalciferol (VITAMIN D3) 50 MCG (2000 UT) capsule 1 capsule     CVS D3 50 MCG (2000 UT) CAPS Take 1 capsule by mouth daily.     esomeprazole (NEXIUM) 20 MG capsule Take 20 mg by mouth daily as needed (heartburn).     estradiol (ESTRACE) 0.1 MG/GM vaginal cream Place 1 g vaginally 2 (two) times a week.     fexofenadine (ALLEGRA) 180 MG tablet Take 180 mg by mouth daily.     furosemide (LASIX) 40 MG tablet Take 1 tablet by mouth daily as needed.     glucose blood (PRODIGY NO CODING BLOOD GLUC) test strip 1 each by Other route as needed for other. Use as instructed 100 each 11   methimazole (TAPAZOLE) 5 MG tablet Take by mouth.     pseudoephedrine-guaifenesin (MUCINEX D) 60-600 MG 12 hr tablet Take 1 tablet by mouth every 12 (twelve) hours.     No current facility-administered medications on file prior to visit.    Allergies  Allergen Reactions   Codeine Other (See Comments)   Penicillin G Benzathine Other (See Comments)   Penicillins Hives   Amlodipine  Besy-Benazepril Hcl Rash    Social History   Socioeconomic History   Marital status: Widowed    Spouse name: Not on file   Number  of children: Not on file   Years of education: Not on file   Highest education level: Not on file  Occupational History   Not on file  Tobacco Use   Smoking status: Never    Passive exposure: Never   Smokeless tobacco: Never  Vaping Use   Vaping status: Never Used  Substance and Sexual Activity   Alcohol use: No    Alcohol/week: 0.0 standard drinks of alcohol   Drug use: No   Sexual activity: Not Currently  Other Topics Concern   Not on file  Social History Narrative   Not on file   Social  Drivers of Health   Financial Resource Strain: Low Risk  (06/13/2023)   Overall Financial Resource Strain (CARDIA)    Difficulty of Paying Living Expenses: Not hard at all  Food Insecurity: Low Risk  (08/28/2023)   Received from [facility]   Hunger Vital Sign    Within the past 12 months, you worried that your food would run out before you got money to buy more: Never true    Within the past 12 months, the food you bought just didn't last and you didn't have money to get more. : Never true  Transportation Needs: No Transportation Needs (08/28/2023)   Received from [facility]    In the past 12 months, has lack of reliable transportation kept you from medical appointments, meetings, work or from getting things needed for daily living? : No  Physical Activity: Inactive (06/13/2023)   Exercise Vital Sign    Days of Exercise per Week: 0 days    Minutes of Exercise per Session: 0 min  Stress: No Stress Concern Present (06/13/2023)   [employer] of Occupational Health - Occupational Stress Questionnaire    Feeling of Stress : Not at all  Social Connections: Moderately Isolated (06/13/2023)   Social Connection and Isolation Panel    Frequency of Communication with Friends and Family: More than three times a week    Frequency of Social Gatherings with Friends and Family: More than three times a week    Attends Religious Services: More than 4 times per year    Active Member of [employer] or Organizations: No    Attends Banker Meetings: Never    Marital Status: Widowed  Intimate Partner Violence: Not At Risk (06/13/2023)   Humiliation, Afraid, Rape, and Kick questionnaire    Fear of Current or Ex-Partner: No    Emotionally Abused: No    Physically Abused: No    Sexually Abused: No    Family History  Problem Relation Age of Onset   Heart disease Mother    Cancer Father    Heart disease Brother    Breast cancer Daughter     Past Surgical History:   Procedure Laterality Date   ABDOMINAL HYSTERECTOMY     APPENDECTOMY     BREAST BIOPSY Right 2009   BREAST EXCISIONAL BIOPSY Right 2002   CATARACT EXTRACTION  2019   HERNIA REPAIR      ROS: Review of Systems Negative except as stated above  PHYSICAL EXAM: BP 138/72   Pulse 66   Temp 98 F (36.7 C) (Oral)   Resp 16   Ht 5' 3 (1.6 m)   Wt 131 lb 12.8 oz (59.8 kg)   SpO2 97%   BMI 23.35 kg/m   Physical Exam HENT:     Head: Normocephalic and atraumatic.     Nose: Nose normal.  Mouth/Throat:     Mouth: Mucous membranes are moist.     Pharynx: Oropharynx is clear.  Eyes:     Extraocular Movements: Extraocular movements intact.     Conjunctiva/sclera: Conjunctivae normal.     Pupils: Pupils are equal, round, and reactive to light.  Cardiovascular:     Rate and Rhythm: Normal rate and regular rhythm.     Pulses: Normal pulses.     Heart sounds: Normal heart sounds.  Pulmonary:     Effort: Pulmonary effort is normal.     Breath sounds: Normal breath sounds.  Musculoskeletal:        General: Normal range of motion.     Cervical back: Normal range of motion and neck supple.  Neurological:     General: No focal deficit present.     Mental Status: She is alert and oriented to person, place, and time.  Psychiatric:        Mood and Affect: Mood normal.        Behavior: Behavior normal.    ASSESSMENT AND PLAN: 1. Primary hypertension (Primary) - Continue Amlodipine , Losartan , and Carvedilol  as prescribed.  - Counseled on blood pressure goal of less than 130/80, low-sodium, DASH diet, medication compliance, and 150 minutes of moderate intensity exercise per week as tolerated. Counseled on medication adherence and adverse effects. - Keep all scheduled appointments with established Cardiology.  - Follow-up with primary provider as scheduled.  - amLODipine  (NORVASC ) 10 MG tablet; Take 1 tablet (10 mg total) by mouth daily.  Dispense: 90 tablet; Refill: 0 - losartan   (COZAAR ) 100 MG tablet; Take 1 tablet (100 mg total) by mouth daily.  Dispense: 90 tablet; Refill: 0 - carvedilol  (COREG ) 3.125 MG tablet; Take 1 tablet (3.125 mg total) by mouth 2 (two) times daily with a meal.  Dispense: 180 tablet; Refill: 0  2. Type 2 diabetes mellitus with hyperglycemia, unspecified whether long term insulin use (HCC) - Hemoglobin A1c 6.5% on 12/02/2023.  - Continue present management as managed by specialist.  - Discussed the importance of healthy eating habits, low-carbohydrate diet, low-sugar diet, regular aerobic exercise (at least 150 minutes a week as tolerated) and medication compliance to achieve or maintain control of diabetes. Counseled on medication adherence/adverse effects.  - Follow-up with primary provider as scheduled.   3. Diabetic eye exam [facility]) - Referral to Ophthalmology for evaluation/management. - Ambulatory referral to Ophthalmology  4. Encounter for diabetic foot exam [facility]) - Referral to Podiatry for evaluation/management.  - Ambulatory referral to Podiatry  5. Hyperlipidemia, unspecified hyperlipidemia type - Continue Atorvastatin  as prescribed. Counseled on medication adherence/adverse effects.  - Routine screening.  - Follow-up with primary provider as scheduled.  - atorvastatin  (LIPITOR) 20 MG tablet; Take 1 tablet (20 mg total) by mouth daily.  Dispense: 90 tablet; Refill: 0 - Lipid panel  6. Upper respiratory symptom - Patient today in office with no cardiopulmonary/acute distress. - Continue present management. - Patient declined respiratory panel and additional testing.  - Follow-up with primary provider as scheduled.    Patient was given the opportunity to ask questions.  Patient verbalized understanding of the plan and was able to repeat key elements of the plan. Patient was given clear instructions to go to Emergency Department or return to medical center if symptoms don't improve, worsen, or new problems develop.The patient  verbalized understanding.   Orders Placed This Encounter  Procedures   Lipid panel   Ambulatory referral to Podiatry   Ambulatory referral to Ophthalmology  Requested Prescriptions   Signed Prescriptions Disp Refills   amLODipine  (NORVASC ) 10 MG tablet 90 tablet 0    Sig: Take 1 tablet (10 mg total) by mouth daily.   losartan  (COZAAR ) 100 MG tablet 90 tablet 0    Sig: Take 1 tablet (100 mg total) by mouth daily.   carvedilol  (COREG ) 3.125 MG tablet 180 tablet 0    Sig: Take 1 tablet (3.125 mg total) by mouth 2 (two) times daily with a meal.   atorvastatin  (LIPITOR) 20 MG tablet 90 tablet 0    Sig: Take 1 tablet (20 mg total) by mouth daily.    Return in about 3 months (around 04/14/2024) for Follow-Up or next available chronic conditions.  Greig JINNY Drones, NP

## 2024-01-13 NOTE — Progress Notes (Signed)
 3 month follow up, mucous in throat

## 2024-01-14 ENCOUNTER — Ambulatory Visit: Payer: Self-pay | Admitting: Family

## 2024-01-14 LAB — LIPID PANEL
Chol/HDL Ratio: 2.4 ratio (ref 0.0–4.4)
Cholesterol, Total: 155 mg/dL (ref 100–199)
HDL: 64 mg/dL (ref >39)
LDL Chol Calc (NIH): 75 mg/dL (ref 0–99)
Triglycerides: 88 mg/dL (ref 0–149)
VLDL Cholesterol Cal: 16 mg/dL (ref 5–40)

## 2024-01-15 DIAGNOSIS — R609 Edema, unspecified: Secondary | ICD-10-CM | POA: Diagnosis not present

## 2024-01-15 DIAGNOSIS — D631 Anemia in chronic kidney disease: Secondary | ICD-10-CM | POA: Diagnosis not present

## 2024-01-15 DIAGNOSIS — E559 Vitamin D deficiency, unspecified: Secondary | ICD-10-CM | POA: Diagnosis not present

## 2024-01-15 DIAGNOSIS — I129 Hypertensive chronic kidney disease with stage 1 through stage 4 chronic kidney disease, or unspecified chronic kidney disease: Secondary | ICD-10-CM | POA: Diagnosis not present

## 2024-01-15 DIAGNOSIS — E1122 Type 2 diabetes mellitus with diabetic chronic kidney disease: Secondary | ICD-10-CM | POA: Diagnosis not present

## 2024-01-15 DIAGNOSIS — N184 Chronic kidney disease, stage 4 (severe): Secondary | ICD-10-CM | POA: Diagnosis not present

## 2024-02-03 ENCOUNTER — Encounter: Payer: Self-pay | Admitting: Podiatry

## 2024-02-03 ENCOUNTER — Ambulatory Visit: Admitting: Podiatry

## 2024-02-03 VITALS — Ht 63.0 in | Wt 131.0 lb

## 2024-02-03 DIAGNOSIS — B351 Tinea unguium: Secondary | ICD-10-CM

## 2024-02-03 DIAGNOSIS — E1140 Type 2 diabetes mellitus with diabetic neuropathy, unspecified: Secondary | ICD-10-CM

## 2024-02-03 DIAGNOSIS — E1149 Type 2 diabetes mellitus with other diabetic neurological complication: Secondary | ICD-10-CM | POA: Diagnosis not present

## 2024-02-03 DIAGNOSIS — L84 Corns and callosities: Secondary | ICD-10-CM | POA: Diagnosis not present

## 2024-02-03 DIAGNOSIS — M79675 Pain in left toe(s): Secondary | ICD-10-CM | POA: Diagnosis not present

## 2024-02-03 DIAGNOSIS — M79674 Pain in right toe(s): Secondary | ICD-10-CM

## 2024-02-05 NOTE — Progress Notes (Signed)
 Subjective:   Patient ID: Carolyn Bowen, female   DOB: 89 y.o.   MRN: [id number]   HPI Patient presents stating that she has very painful lesion plantar left foot long-term history diabetes under good control and digital deformities with thick yellow brittle nailbeds 1-5 both feet that she cannot take care of and become painful in shoes.  Patient does not smoke is not active   Review of Systems  All other systems reviewed and are negative.       Objective:  Physical Exam Vitals and nursing note reviewed.  Constitutional:      Appearance: She is well-developed.  Pulmonary:     Effort: Pulmonary effort is normal.  Musculoskeletal:        General: Normal range of motion.  Skin:    General: Skin is warm.  Neurological:     Mental Status: She is alert.     Neuro vascular status moderately diminished but intact for her age with diminishment of sharp dull vibratory with long-term diabetes indicating probable mild diabetic neuropathy.  Patient has severe lesion subthird metatarsal left measuring about 1 cm x 1 cm that is very painful and hard to walk on and has nail disease 1-5 both feet that are thick yellow brittle and she cannot cut.  Patient does have good digital perfusion she is well oriented     Assessment:  Long-term diabetic and reasonably good control with mycotic nail infection and painful lesion left     Plan:  H&P all conditions reviewed discussed.  Diabetic inspections recommended and today I debrided nailbeds 1-5 both feet no iatrogenic bleeding and debrided lesion left with no iatrogenic bleeding and can return for routine care

## 2024-02-26 DIAGNOSIS — Z4689 Encounter for fitting and adjustment of other specified devices: Secondary | ICD-10-CM | POA: Diagnosis not present

## 2024-04-15 ENCOUNTER — Ambulatory Visit: Admitting: Family

## 2024-05-13 ENCOUNTER — Ambulatory Visit: Admitting: Family

## 2024-06-25 ENCOUNTER — Ambulatory Visit
